# Patient Record
Sex: Female | Born: 1965 | Race: White | Hispanic: No | Marital: Single | Smoking: Former smoker
Health system: Southern US, Community
[De-identification: ages and names within clinical notes are randomized; demographics above are authoritative.]

## PROBLEM LIST (undated history)

## (undated) DIAGNOSIS — D171 Benign lipomatous neoplasm of skin and subcutaneous tissue of trunk: Secondary | ICD-10-CM

## (undated) DIAGNOSIS — R600 Localized edema: Secondary | ICD-10-CM

## (undated) DIAGNOSIS — M2550 Pain in unspecified joint: Secondary | ICD-10-CM

## (undated) DIAGNOSIS — R0602 Shortness of breath: Secondary | ICD-10-CM

## (undated) DIAGNOSIS — R7303 Prediabetes: Secondary | ICD-10-CM

## (undated) DIAGNOSIS — M549 Dorsalgia, unspecified: Secondary | ICD-10-CM

## (undated) DIAGNOSIS — E7800 Pure hypercholesterolemia, unspecified: Secondary | ICD-10-CM

## (undated) DIAGNOSIS — R1310 Dysphagia, unspecified: Secondary | ICD-10-CM

## (undated) DIAGNOSIS — I890 Lymphedema, not elsewhere classified: Secondary | ICD-10-CM

## (undated) DIAGNOSIS — K219 Gastro-esophageal reflux disease without esophagitis: Secondary | ICD-10-CM

## (undated) HISTORY — DX: Gastro-esophageal reflux disease without esophagitis: K21.9

## (undated) HISTORY — DX: Pain in unspecified joint: M25.50

## (undated) HISTORY — DX: Pure hypercholesterolemia, unspecified: E78.00

## (undated) HISTORY — PX: TUBAL LIGATION: SHX77

## (undated) HISTORY — DX: Localized edema: R60.0

## (undated) HISTORY — DX: Shortness of breath: R06.02

## (undated) HISTORY — DX: Prediabetes: R73.03

## (undated) HISTORY — DX: Benign lipomatous neoplasm of skin and subcutaneous tissue of trunk: D17.1

## (undated) HISTORY — DX: Dorsalgia, unspecified: M54.9

## (undated) HISTORY — DX: Lymphedema, not elsewhere classified: I89.0

## (undated) HISTORY — DX: Dysphagia, unspecified: R13.10

---

## 2008-01-01 ENCOUNTER — Emergency Department (HOSPITAL_BASED_OUTPATIENT_CLINIC_OR_DEPARTMENT_OTHER): Admission: EM | Admit: 2008-01-01 | Discharge: 2008-01-01 | Payer: Self-pay | Admitting: Emergency Medicine

## 2008-05-20 ENCOUNTER — Ambulatory Visit: Payer: Self-pay | Admitting: Diagnostic Radiology

## 2008-05-20 ENCOUNTER — Emergency Department (HOSPITAL_COMMUNITY): Admission: EM | Admit: 2008-05-20 | Discharge: 2008-05-21 | Payer: Self-pay | Admitting: Emergency Medicine

## 2008-05-20 ENCOUNTER — Emergency Department (HOSPITAL_BASED_OUTPATIENT_CLINIC_OR_DEPARTMENT_OTHER): Admission: EM | Admit: 2008-05-20 | Discharge: 2008-05-20 | Payer: Self-pay | Admitting: Emergency Medicine

## 2008-12-01 ENCOUNTER — Emergency Department (HOSPITAL_BASED_OUTPATIENT_CLINIC_OR_DEPARTMENT_OTHER): Admission: EM | Admit: 2008-12-01 | Discharge: 2008-12-02 | Payer: Self-pay | Admitting: Emergency Medicine

## 2008-12-02 ENCOUNTER — Ambulatory Visit: Payer: Self-pay | Admitting: Radiology

## 2009-01-13 ENCOUNTER — Emergency Department (HOSPITAL_BASED_OUTPATIENT_CLINIC_OR_DEPARTMENT_OTHER): Admission: EM | Admit: 2009-01-13 | Discharge: 2009-01-14 | Payer: Self-pay | Admitting: Emergency Medicine

## 2010-04-07 LAB — URINALYSIS, ROUTINE W REFLEX MICROSCOPIC
Bilirubin Urine: NEGATIVE
Hgb urine dipstick: NEGATIVE
Ketones, ur: NEGATIVE mg/dL
Specific Gravity, Urine: 1.033 — ABNORMAL HIGH (ref 1.005–1.030)
pH: 5 (ref 5.0–8.0)

## 2010-04-07 LAB — COMPREHENSIVE METABOLIC PANEL
ALT: 16 U/L (ref 0–35)
AST: 22 U/L (ref 0–37)
Alkaline Phosphatase: 73 U/L (ref 39–117)
CO2: 27 mEq/L (ref 19–32)
Calcium: 9.4 mg/dL (ref 8.4–10.5)
GFR calc Af Amer: >60 mL/min (ref >60)
GFR calc non Af Amer: >60 mL/min (ref >60)
Glucose, Bld: 93 mg/dL (ref 70–99)
Potassium: 4.4 mEq/L (ref 3.5–5.1)
Sodium: 141 mEq/L (ref 135–145)
Total Protein: 7.9 g/dL (ref 6.0–8.3)

## 2010-04-07 LAB — DIFFERENTIAL
Basophils Relative: 2 % — ABNORMAL HIGH (ref 0–1)
Eosinophils Absolute: 0.1 10*3/uL (ref 0.0–0.7)
Eosinophils Relative: 1 % (ref 0–5)
Lymphs Abs: 3.5 10*3/uL (ref 0.7–4.0)
Monocytes Relative: 6 % (ref 3–12)
Neutrophils Relative %: 66 % (ref 43–77)

## 2010-04-07 LAB — CBC
Hemoglobin: 14 g/dL (ref 12.0–15.0)
MCHC: 34.7 g/dL (ref 30.0–36.0)
RBC: 4.27 MIL/uL (ref 3.87–5.11)

## 2010-04-07 LAB — URINE MICROSCOPIC-ADD ON

## 2010-04-13 LAB — URINE CULTURE: Colony Count: NO GROWTH

## 2010-04-13 LAB — URINALYSIS, ROUTINE W REFLEX MICROSCOPIC
Glucose, UA: NEGATIVE mg/dL
Hgb urine dipstick: NEGATIVE
Ketones, ur: 15 mg/dL — AB
pH: 6 (ref 5.0–8.0)

## 2012-06-08 DIAGNOSIS — Z801 Family history of malignant neoplasm of trachea, bronchus and lung: Secondary | ICD-10-CM | POA: Insufficient documentation

## 2012-06-08 DIAGNOSIS — J449 Chronic obstructive pulmonary disease, unspecified: Secondary | ICD-10-CM | POA: Insufficient documentation

## 2012-06-08 DIAGNOSIS — Z87891 Personal history of nicotine dependence: Secondary | ICD-10-CM | POA: Insufficient documentation

## 2012-06-14 DIAGNOSIS — E785 Hyperlipidemia, unspecified: Secondary | ICD-10-CM | POA: Insufficient documentation

## 2013-01-23 DIAGNOSIS — G4730 Sleep apnea, unspecified: Secondary | ICD-10-CM | POA: Insufficient documentation

## 2013-02-22 DIAGNOSIS — I872 Venous insufficiency (chronic) (peripheral): Secondary | ICD-10-CM | POA: Insufficient documentation

## 2015-03-23 ENCOUNTER — Emergency Department (HOSPITAL_BASED_OUTPATIENT_CLINIC_OR_DEPARTMENT_OTHER): Payer: 59

## 2015-03-23 ENCOUNTER — Encounter (HOSPITAL_BASED_OUTPATIENT_CLINIC_OR_DEPARTMENT_OTHER): Payer: Self-pay | Admitting: Emergency Medicine

## 2015-03-23 ENCOUNTER — Emergency Department (HOSPITAL_BASED_OUTPATIENT_CLINIC_OR_DEPARTMENT_OTHER)
Admission: EM | Admit: 2015-03-23 | Discharge: 2015-03-24 | Disposition: A | Discharge status: Home / Self Care | Payer: 59 | Attending: Emergency Medicine | Admitting: Emergency Medicine

## 2015-03-23 DIAGNOSIS — F17200 Nicotine dependence, unspecified, uncomplicated: Secondary | ICD-10-CM | POA: Diagnosis not present

## 2015-03-23 DIAGNOSIS — R61 Generalized hyperhidrosis: Secondary | ICD-10-CM | POA: Diagnosis not present

## 2015-03-23 DIAGNOSIS — R0789 Other chest pain: Secondary | ICD-10-CM | POA: Insufficient documentation

## 2015-03-23 DIAGNOSIS — J209 Acute bronchitis, unspecified: Secondary | ICD-10-CM | POA: Insufficient documentation

## 2015-03-23 DIAGNOSIS — R079 Chest pain, unspecified: Secondary | ICD-10-CM | POA: Diagnosis present

## 2015-03-23 LAB — CBC WITH DIFFERENTIAL/PLATELET
BASOS PCT: 0 %
Basophils Absolute: 0 10*3/uL (ref 0.0–0.1)
EOS ABS: 0.2 10*3/uL (ref 0.0–0.7)
EOS PCT: 1 %
HCT: 40.5 % (ref 36.0–46.0)
Hemoglobin: 13.4 g/dL (ref 12.0–15.0)
LYMPHS ABS: 4 10*3/uL (ref 0.7–4.0)
Lymphocytes Relative: 37 %
MCH: 32.1 pg (ref 26.0–34.0)
MCHC: 33.1 g/dL (ref 30.0–36.0)
MCV: 96.9 fL (ref 78.0–100.0)
Monocytes Absolute: 0.7 10*3/uL (ref 0.1–1.0)
Monocytes Relative: 6 %
NEUTROS PCT: 56 %
Neutro Abs: 6 10*3/uL (ref 1.7–7.7)
PLATELETS: 324 10*3/uL (ref 150–400)
RBC: 4.18 MIL/uL (ref 3.87–5.11)
RDW: 13.4 % (ref 11.5–15.5)
WBC: 10.8 10*3/uL — AB (ref 4.0–10.5)

## 2015-03-23 LAB — BASIC METABOLIC PANEL
Anion gap: 7 (ref 5–15)
BUN: 23 mg/dL — AB (ref 6–20)
CO2: 27 mmol/L (ref 22–32)
CREATININE: 0.53 mg/dL (ref 0.44–1.00)
Calcium: 8.8 mg/dL — ABNORMAL LOW (ref 8.9–10.3)
Chloride: 102 mmol/L (ref 101–111)
Glucose, Bld: 96 mg/dL (ref 65–99)
POTASSIUM: 4.1 mmol/L (ref 3.5–5.1)
SODIUM: 136 mmol/L (ref 135–145)

## 2015-03-23 LAB — TROPONIN I

## 2015-03-23 MED ORDER — IPRATROPIUM-ALBUTEROL 0.5-2.5 (3) MG/3ML IN SOLN
3.0000 mL | RESPIRATORY_TRACT | Status: DC
  Administered 2015-03-23: 3 mL via RESPIRATORY_TRACT
  Filled 2015-03-23: qty 3

## 2015-03-23 NOTE — ED Notes (Signed)
Patient states that she was moving a cart at work and started to have pain to her left chest. The patient reports that is is worse with movement and  Taking a deep breath

## 2015-03-23 NOTE — ED Notes (Addendum)
Pt states she was passing meds at work and developed a shooting pain in the middle of her chest. Denies other s/s. BBS diminished. Edema noted to lower ext. Pt states this occurs when she works a lot.

## 2015-03-23 NOTE — ED Provider Notes (Addendum)
CSN: [id number]     Arrival date & time 03/23/15  2010 History  By signing my name below, I, Helane Gunther, attest that this documentation has been prepared under the direction and in the presence of Shanon Rosser, MD. Electronically Signed: Helane Gunther, ED Scribe. 03/23/2015. 11:07 PM.      Chief Complaint  Patient presents with  . Chest Pain   The history is provided by the patient. No language interpreter was used.   HPI Comments: Michele Ayers is a 50 y.o. female who presents to the Emergency Department complaining of sharp, shooting, well localized, left-sided chest pain onset between 3 and 4 hours ago. Pt states she was at work when the pain began, lasting for about 15 minutes before resolving, then resuming once more and growing constant. She currently rates her pain as a 5/10. She reports associated diaphoresis with the first episode only, as well as continued SOB. She notes exacerbation of the pain with deep breathing and movement, and alleviation with sitting slightly propped up at rest. She notes her legs are typically swollen when she works a lot of hours and is on her feet for long periods of time. Pt denies n/v/d.  History reviewed. No pertinent past medical history. Past Surgical History  Procedure Laterality Date  . Tubal ligation     History reviewed. No pertinent family history. Social History  Substance Use Topics  . Smoking status: Current Every Day Smoker  . Smokeless tobacco: None  . Alcohol Use: No   OB History    No data available     Review of Systems  All other systems reviewed and are negative.   Allergies  Review of patient's allergies indicates no known allergies.  Home Medications   Prior to Admission medications   Not on File   BP 104/70 mmHg  Pulse 78  Temp(Src) 98.7 F (37.1 C) (Oral)  Resp 20  Ht 5\' 4"  (1.626 m)  Wt 270 lb (122.471 kg)  BMI 46.32 kg/m2  SpO2 96%  LMP 03/09/2015 Physical Exam General: Well-developed,  well-nourished female in no acute distress; appearance consistent with age of record HENT: normocephalic; atraumatic Eyes: pupils equal, round and reactive to light; extraocular muscles intact Neck: supple Heart: regular rate and rhythm; no murmurs, rubs or gallops Lungs: decreased breath sounds bilaterally Chest: Left upper chest wall tenderness that is not exactly the same pain is that of the chief complaint Abdomen: soft; nondistended; nontender; no masses or hepatosplenomegaly; bowel sounds present Extremities: No deformity; full range of motion; pulses normal; 2+ pitting edema of the lower legs with chronic-appearing stasis changes Neurologic: Awake, alert and oriented; motor function intact in all extremities and symmetric; no facial droop Skin: Warm and dry Psychiatric: Normal mood and affect  ED Course  Procedures   MDM   Nursing notes and vitals signs, including pulse oximetry, reviewed.  Summary of this visit's results, reviewed by myself:   EKG Interpretation  Date/Time:  Monday March 23 2015 20:21:09 EDT Ventricular Rate:  81 PR Interval:  162 QRS Duration: 86 QT Interval:  364 QTC Calculation: 422 R Axis:   72 Text Interpretation:  Normal sinus rhythm Normal ECG No previous ECGs available Confirmed by Kamrin Spath  MD, Jenny Reichmann ([id number]) on 03/23/2015 10:51:04 PM       Labs:  Results for orders placed or performed during the hospital encounter of 03/23/15 (from the past 24 hour(s))  CBC with Differential/Platelet     Status: Abnormal   Collection Time: 03/23/15 11:20  PM  Result Value Ref Range   WBC 10.8 (H) 4.0 - 10.5 K/uL   RBC 4.18 3.87 - 5.11 MIL/uL   Hemoglobin 13.4 12.0 - 15.0 g/dL   HCT 40.5 36.0 - 46.0 %   MCV 96.9 78.0 - 100.0 fL   MCH 32.1 26.0 - 34.0 pg   MCHC 33.1 30.0 - 36.0 g/dL   RDW 13.4 11.5 - 15.5 %   Platelets 324 150 - 400 K/uL   Neutrophils Relative % 56 %   Neutro Abs 6.0 1.7 - 7.7 K/uL   Lymphocytes Relative 37 %   Lymphs Abs 4.0 0.7 - 4.0  K/uL   Monocytes Relative 6 %   Monocytes Absolute 0.7 0.1 - 1.0 K/uL   Eosinophils Relative 1 %   Eosinophils Absolute 0.2 0.0 - 0.7 K/uL   Basophils Relative 0 %   Basophils Absolute 0.0 0.0 - 0.1 K/uL  Basic metabolic panel     Status: Abnormal   Collection Time: 03/23/15 11:20 PM  Result Value Ref Range   Sodium 136 135 - 145 mmol/L   Potassium 4.1 3.5 - 5.1 mmol/L   Chloride 102 101 - 111 mmol/L   CO2 27 22 - 32 mmol/L   Glucose, Bld 96 65 - 99 mg/dL   BUN 23 (H) 6 - 20 mg/dL   Creatinine, Ser 0.53 0.44 - 1.00 mg/dL   Calcium 8.8 (L) 8.9 - 10.3 mg/dL   GFR calc non Af Amer >60 >60 mL/min   GFR calc Af Amer >60 >60 mL/min   Anion gap 7 5 - 15  Troponin I     Status: None   Collection Time: 03/23/15 11:20 PM  Result Value Ref Range   Troponin I <0.03 <0.031 ng/mL    Imaging Studies: Dg Chest 2 View  03/23/2015  CLINICAL DATA:  Left-sided chest pain beginning tonight. Current smoker. EXAM: CHEST  2 VIEW COMPARISON:  None. FINDINGS: The cardiomediastinal silhouette is within normal limits. The lungs are well inflated with mild central airway thickening and mild basilar predominant interstitial prominence. No confluent airspace opacity, edema, pleural effusion, or pneumothorax is identified. Thoracic spondylosis is noted. IMPRESSION: Mild bronchitic changes. Electronically Signed   By: Logan Bores M.D.   On: 03/23/2015 21:24   12:05 AM Patient's pain is atypical for cardiac etiology. It is reproduced with movement and deep breathing. Her breathing is improved after a DuoNeb treatment and we will treat her with an inhaler.   I personally performed the services described in this documentation, which was scribed in my presence. The recorded information has been reviewed and is accurate.   Shanon Rosser, MD 03/24/15 [id number]  Shanon Rosser, MD 03/24/15 [id number]

## 2015-03-24 MED ORDER — ALBUTEROL SULFATE HFA 108 (90 BASE) MCG/ACT IN AERS
2.0000 | INHALATION_SPRAY | RESPIRATORY_TRACT | Status: DC | PRN
  Administered 2015-03-24: 2 via RESPIRATORY_TRACT
  Filled 2015-03-24: qty 6.7

## 2015-03-24 NOTE — ED Notes (Signed)
Pt given d/c instructions as per chart. Verbalizes understanding. No questions. 

## 2015-06-11 ENCOUNTER — Emergency Department (HOSPITAL_BASED_OUTPATIENT_CLINIC_OR_DEPARTMENT_OTHER): Payer: 59

## 2015-06-11 ENCOUNTER — Emergency Department (HOSPITAL_BASED_OUTPATIENT_CLINIC_OR_DEPARTMENT_OTHER)
Admission: EM | Admit: 2015-06-11 | Discharge: 2015-06-11 | Disposition: A | Discharge status: Home / Self Care | Payer: 59 | Attending: Emergency Medicine | Admitting: Emergency Medicine

## 2015-06-11 ENCOUNTER — Encounter (HOSPITAL_BASED_OUTPATIENT_CLINIC_OR_DEPARTMENT_OTHER): Payer: Self-pay

## 2015-06-11 DIAGNOSIS — F17200 Nicotine dependence, unspecified, uncomplicated: Secondary | ICD-10-CM | POA: Insufficient documentation

## 2015-06-11 DIAGNOSIS — L03119 Cellulitis of unspecified part of limb: Secondary | ICD-10-CM | POA: Diagnosis not present

## 2015-06-11 DIAGNOSIS — M7989 Other specified soft tissue disorders: Secondary | ICD-10-CM | POA: Diagnosis present

## 2015-06-11 DIAGNOSIS — R609 Edema, unspecified: Secondary | ICD-10-CM

## 2015-06-11 LAB — COMPREHENSIVE METABOLIC PANEL
ALBUMIN: 3.9 g/dL (ref 3.5–5.0)
ALK PHOS: 66 U/L (ref 38–126)
ALT: 16 U/L (ref 14–54)
AST: 17 U/L (ref 15–41)
Anion gap: 8 (ref 5–15)
BUN: 15 mg/dL (ref 6–20)
CHLORIDE: 98 mmol/L — AB (ref 101–111)
CO2: 27 mmol/L (ref 22–32)
CREATININE: 0.7 mg/dL (ref 0.44–1.00)
Calcium: 9.3 mg/dL (ref 8.9–10.3)
Glucose, Bld: 90 mg/dL (ref 65–99)
POTASSIUM: 3.9 mmol/L (ref 3.5–5.1)
SODIUM: 133 mmol/L — AB (ref 135–145)
TOTAL PROTEIN: 7.4 g/dL (ref 6.5–8.1)
Total Bilirubin: 0.7 mg/dL (ref 0.3–1.2)

## 2015-06-11 LAB — CBC WITH DIFFERENTIAL/PLATELET
BASOS ABS: 0 10*3/uL (ref 0.0–0.1)
BASOS PCT: 0 %
EOS ABS: 0.1 10*3/uL (ref 0.0–0.7)
Eosinophils Relative: 1 %
HCT: 39.7 % (ref 36.0–46.0)
HEMOGLOBIN: 13.2 g/dL (ref 12.0–15.0)
Lymphocytes Relative: 31 %
Lymphs Abs: 3.4 10*3/uL (ref 0.7–4.0)
MCH: 32.4 pg (ref 26.0–34.0)
MCHC: 33.2 g/dL (ref 30.0–36.0)
MCV: 97.5 fL (ref 78.0–100.0)
Monocytes Absolute: 0.9 10*3/uL (ref 0.1–1.0)
Monocytes Relative: 8 %
NEUTROS PCT: 60 %
Neutro Abs: 6.4 10*3/uL (ref 1.7–7.7)
Platelets: 337 10*3/uL (ref 150–400)
RBC: 4.07 MIL/uL (ref 3.87–5.11)
RDW: 13.4 % (ref 11.5–15.5)
WBC: 10.7 10*3/uL — AB (ref 4.0–10.5)

## 2015-06-11 MED ORDER — CEPHALEXIN 250 MG PO CAPS
500.0000 mg | ORAL_CAPSULE | Freq: Once | ORAL | Status: AC
  Administered 2015-06-11: 500 mg via ORAL
  Filled 2015-06-11: qty 2

## 2015-06-11 MED ORDER — FUROSEMIDE 20 MG PO TABS: 20.0000 mg | ORAL_TABLET | Freq: Every day | ORAL | Status: DC | PRN

## 2015-06-11 MED ORDER — CEPHALEXIN 500 MG PO CAPS: 500.0000 mg | ORAL_CAPSULE | Freq: Three times a day (TID) | ORAL | Status: DC

## 2015-06-11 NOTE — ED Notes (Signed)
C/o bilat LE swelling x "couple weeks"-NAD-steady gait

## 2015-06-11 NOTE — ED Provider Notes (Signed)
CSN: [id number]     Arrival date & time 06/11/15  1556 History   First MD Initiated Contact with Patient 06/11/15 1622     Chief Complaint  Patient presents with  . Leg Swelling      HPI  Patient presents for evaluation of leg swelling. She reports progressive leg swelling over the last few weeks. She works on her feet most of the day. Doesn't do a lot of sitting. Does smoke. No estrogen use. No history of malignancy. No prolonged immobilization cast once fractures surgeries malignancies or DVT or PE risk. No history DVT or PE. She noticed that her left leg became red on the anterior shin of the right foot became red as well and she became concerned about infection or blood clot and presents here.  History reviewed. No pertinent past medical history. Past Surgical History  Procedure Laterality Date  . Tubal ligation     No family history on file. Social History  Substance Use Topics  . Smoking status: Current Every Day Smoker  . Smokeless tobacco: None  . Alcohol Use: No   OB History    No data available     Review of Systems  Constitutional: Negative for fever, chills, diaphoresis, appetite change and fatigue.  HENT: Negative for mouth sores, sore throat and trouble swallowing.   Eyes: Negative for visual disturbance.  Respiratory: Negative for cough, chest tightness, shortness of breath and wheezing.   Cardiovascular: Positive for leg swelling. Negative for chest pain.  Gastrointestinal: Negative for nausea, vomiting, abdominal pain, diarrhea and abdominal distention.  Endocrine: Negative for polydipsia, polyphagia and polyuria.  Genitourinary: Negative for dysuria, frequency and hematuria.  Musculoskeletal: Negative for gait problem.  Skin: Positive for color change. Negative for pallor and rash.  Neurological: Negative for dizziness, syncope, light-headedness and headaches.  Hematological: Does not bruise/bleed easily.  Psychiatric/Behavioral: Negative for behavioral  problems and confusion.      Allergies  Review of patient's allergies indicates no known allergies.  Home Medications   Prior to Admission medications   Medication Sig Start Date End Date Taking? Authorizing Provider  cephALEXin (KEFLEX) 500 MG capsule Take 1 capsule (500 mg total) by mouth 3 (three) times daily. 06/11/15   Tanna Furry, MD  furosemide (LASIX) 20 MG tablet Take 1 tablet (20 mg total) by mouth daily as needed (leg swelling). 06/11/15   Tanna Furry, MD   BP 122/58 mmHg  Pulse 66  Temp(Src) 98.9 F (37.2 C) (Oral)  Resp 20  Ht 5\' 3"  (1.6 m)  Wt 285 lb (129.275 kg)  BMI 50.50 kg/m2  SpO2 96%  LMP 05/28/2015 Physical Exam  Constitutional: She is oriented to person, place, and time. She appears well-developed and well-nourished. No distress.  HENT:  Head: Normocephalic.  Eyes: Conjunctivae are normal. Pupils are equal, round, and reactive to light. No scleral icterus.  Neck: Normal range of motion. Neck supple. No thyromegaly present.  Cardiovascular: Normal rate and regular rhythm.  Exam reveals no gallop and no friction rub.   No murmur heard. Pulmonary/Chest: Effort normal and breath sounds normal. No respiratory distress. She has no wheezes. She has no rales.  Abdominal: Soft. Bowel sounds are normal. She exhibits no distension. There is no tenderness. There is no rebound.  Musculoskeletal: Normal range of motion.  Neurological: She is alert and oriented to person, place, and time.  Skin: Skin is warm and dry. No rash noted.  2+ symmetric bilateral extremity edema. Some erythema of the dorsum of the  right foot. Some erythema and anterior aspect the left lower leg. No palpable cord. He has an area of skin just under the webspace of her first and second toe of the right foot that may be an area than secondarily infected tinea  Psychiatric: She has a normal mood and affect. Her behavior is normal.    ED Course  Procedures (including critical care time) Labs  Review Labs Reviewed  CBC WITH DIFFERENTIAL/PLATELET - Abnormal; Notable for the following:    WBC 10.7 (*)    All other components within normal limits  COMPREHENSIVE METABOLIC PANEL - Abnormal; Notable for the following:    Sodium 133 (*)    Chloride 98 (*)    All other components within normal limits    Imaging Review US Venous Img Lower Bilateral  06/11/2015  CLINICAL DATA:  50 year old female with bilateral lower extremity swelling x2 weeks. EXAM: BILATERAL LOWER EXTREMITY VENOUS DOPPLER ULTRASOUND TECHNIQUE: Gray-scale sonography with graded compression, as well as color Doppler and duplex ultrasound were performed to evaluate the lower extremity deep venous systems from the level of the common femoral vein and including the common femoral, femoral, profunda femoral, popliteal and calf veins including the posterior tibial, peroneal and gastrocnemius veins when visible. The superficial great saphenous vein was also interrogated. Spectral Doppler was utilized to evaluate flow at rest and with distal augmentation maneuvers in the common femoral, femoral and popliteal veins. COMPARISON:  None. FINDINGS: RIGHT LOWER EXTREMITY Common Femoral Vein: No evidence of thrombus. Normal compressibility, respiratory phasicity and response to augmentation. Saphenofemoral Junction: No evidence of thrombus. Normal compressibility and flow on color Doppler imaging. Profunda Femoral Vein: No evidence of thrombus. Normal compressibility and flow on color Doppler imaging. Femoral Vein: No evidence of thrombus. Normal compressibility, respiratory phasicity and response to augmentation. Popliteal Vein: No evidence of thrombus. Normal compressibility, respiratory phasicity and response to augmentation. Calf Veins: The peroneal vein is not well visualized. The visualized portion of the posterior tibial vein appears patent. Superficial Great Saphenous Vein: No evidence of thrombus. Normal compressibility and flow on color  Doppler imaging. Venous Reflux:  None. Other Findings:  Subcutaneous edema noted in the calf. LEFT LOWER EXTREMITY Common Femoral Vein: No evidence of thrombus. Normal compressibility, respiratory phasicity and response to augmentation. Saphenofemoral Junction: No evidence of thrombus. Normal compressibility and flow on color Doppler imaging. Profunda Femoral Vein: No evidence of thrombus. Normal compressibility and flow on color Doppler imaging. Femoral Vein: No evidence of thrombus. Normal compressibility, respiratory phasicity and response to augmentation. Popliteal Vein: No evidence of thrombus. Normal compressibility, respiratory phasicity and response to augmentation. Calf Veins: The the peroneal vein not visualized. The visualized Posterior tibial vein appears patent. Superficial Great Saphenous Vein: No evidence of thrombus. Normal compressibility and flow on color Doppler imaging. Venous Reflux:  None. Other Findings:  Subcutaneous edema noted in the calf. IMPRESSION: No evidence of deep venous thrombosis in the bilateral lower extremities. Bilateral calf subcutaneous edema. Electronically Signed   By: Anner Crete M.D.   On: 06/11/2015 19:13   I have personally reviewed and evaluated these images and lab results as part of my medical decision-making.   EKG Interpretation None      MDM   Final diagnoses:  Dependent edema  Cellulitis of lower extremity, unspecified laterality    Dopplers without DVT. Normal protein, albumin, and renal function. Minimal leukocytosis. Think his symptoms are most consistent with dependent edema. She is likely got some secondary cellulitis on the left shin, as  well as the right foot. Has a area that looks as though it started as tinea under her right great toe and now has a secondary lymphangitis that is subtle. Plan is daily/when necessary Lasix. 7 day course. Primary care for not improving. Support stockings. Elevate legs when possible.    Tanna Furry,  MD 06/11/15 [phone]

## 2015-06-11 NOTE — Discharge Instructions (Signed)
Cellulitis Cellulitis is an infection of the skin and the tissue beneath it. The infected area is usually red and tender. Cellulitis occurs most often in the arms and lower legs.  CAUSES  Cellulitis is caused by bacteria that enter the skin through cracks or cuts in the skin. The most common types of bacteria that cause cellulitis are staphylococci and streptococci. SIGNS AND SYMPTOMS   Redness and warmth.  Swelling.  Tenderness or pain.  Fever. DIAGNOSIS  Your health care provider can usually determine what is wrong based on a physical exam. Blood tests may also be done. TREATMENT  Treatment usually involves taking an antibiotic medicine. HOME CARE INSTRUCTIONS   Take your antibiotic medicine as directed by your health care provider. Finish the antibiotic even if you start to feel better.  Keep the infected arm or leg elevated to reduce swelling.  Apply a warm cloth to the affected area up to 4 times per day to relieve pain.  Take medicines only as directed by your health care provider.  Keep all follow-up visits as directed by your health care provider. SEEK MEDICAL CARE IF:   You notice red streaks coming from the infected area.  Your red area gets larger or turns dark in color.  Your bone or joint underneath the infected area becomes painful after the skin has healed.  Your infection returns in the same area or another area.  You notice a swollen bump in the infected area.  You develop new symptoms.  You have a fever. SEEK IMMEDIATE MEDICAL CARE IF:   You feel very sleepy.  You develop vomiting or diarrhea.  You have a general ill feeling (malaise) with muscle aches and pains.   This information is not intended to replace advice given to you by your health care provider. Make sure you discuss any questions you have with your health care provider.   Document Released: 09/29/2004 Document Revised: 09/10/2014 Document Reviewed: 03/07/2011 Elsevier Interactive  Patient Education 2016 [employer]  Edema Edema is an abnormal buildup of fluids in your bodytissues. Edema is somewhatdependent on gravity to pull the fluid to the lowest place in your body. That makes the condition more common in the legs and thighs (lower extremities). Painless swelling of the feet and ankles is common and becomes more likely as you get older. It is also common in looser tissues, like around your eyes.  When the affected area is squeezed, the fluid may move out of that spot and leave a dent for a few moments. This dent is called pitting.  CAUSES  There are many possible causes of edema. Eating too much salt and being on your feet or sitting for a long time can cause edema in your legs and ankles. Hot weather may make edema worse. Common medical causes of edema include:  Heart failure.  Liver disease.  Kidney disease.  Weak blood vessels in your legs.  Cancer.  An injury.  Pregnancy.  Some medications.  Obesity. SYMPTOMS  Edema is usually painless.Your skin may look swollen or shiny.  DIAGNOSIS  Your health care provider may be able to diagnose edema by asking about your medical history and doing a physical exam. You may need to have tests such as X-rays, an electrocardiogram, or blood tests to check for medical conditions that may cause edema.  TREATMENT  Edema treatment depends on the cause. If you have heart, liver, or kidney disease, you need the treatment appropriate for these conditions. General treatment may include:  Elevation of the affected body part above the level of your heart.  Compression of the affected body part. Pressure from elastic bandages or support stockings squeezes the tissues and forces fluid back into the blood vessels. This keeps fluid from entering the tissues.  Restriction of fluid and salt intake.  Use of a water pill (diuretic). These medications are appropriate only for some types of edema. They pull fluid out of your  body and make you urinate more often. This gets rid of fluid and reduces swelling, but diuretics can have side effects. Only use diuretics as directed by your health care provider. HOME CARE INSTRUCTIONS   Keep the affected body part above the level of your heart when you are lying down.   Do not sit still or stand for prolonged periods.   Do not put anything directly under your knees when lying down.  Do not wear constricting clothing or garters on your upper legs.   Exercise your legs to work the fluid back into your blood vessels. This may help the swelling go down.   Wear elastic bandages or support stockings to reduce ankle swelling as directed by your health care provider.   Eat a low-salt diet to reduce fluid if your health care provider recommends it.   Only take medicines as directed by your health care provider. SEEK MEDICAL CARE IF:   Your edema is not responding to treatment.  You have heart, liver, or kidney disease and notice symptoms of edema.  You have edema in your legs that does not improve after elevating them.   You have sudden and unexplained weight gain. SEEK IMMEDIATE MEDICAL CARE IF:   You develop shortness of breath or chest pain.   You cannot breathe when you lie down.  You develop pain, redness, or warmth in the swollen areas.   You have heart, liver, or kidney disease and suddenly get edema.  You have a fever and your symptoms suddenly get worse. MAKE SURE YOU:   Understand these instructions.  Will watch your condition.  Will get help right away if you are not doing well or get worse.   This information is not intended to replace advice given to you by your health care provider. Make sure you discuss any questions you have with your health care provider.   Document Released: 12/20/2004 Document Revised: 01/10/2014 Document Reviewed: 10/12/2012 Elsevier Interactive Patient Education [employer].

## 2019-01-03 ENCOUNTER — Encounter: Payer: Self-pay | Admitting: Emergency Medicine

## 2019-01-03 ENCOUNTER — Telehealth: Payer: Self-pay | Admitting: *Deleted

## 2019-01-03 ENCOUNTER — Other Ambulatory Visit: Payer: Self-pay

## 2019-01-03 ENCOUNTER — Ambulatory Visit (INDEPENDENT_AMBULATORY_CARE_PROVIDER_SITE_OTHER): Payer: No Typology Code available for payment source | Admitting: Emergency Medicine

## 2019-01-03 VITALS — BP 134/75 | HR 82 | Temp 98.2°F | Resp 16 | Ht 64.0 in | Wt 340.0 lb

## 2019-01-03 DIAGNOSIS — N390 Urinary tract infection, site not specified: Secondary | ICD-10-CM | POA: Diagnosis not present

## 2019-01-03 DIAGNOSIS — L03116 Cellulitis of left lower limb: Secondary | ICD-10-CM

## 2019-01-03 DIAGNOSIS — R350 Frequency of micturition: Secondary | ICD-10-CM | POA: Diagnosis not present

## 2019-01-03 DIAGNOSIS — L03115 Cellulitis of right lower limb: Secondary | ICD-10-CM

## 2019-01-03 DIAGNOSIS — Z7689 Persons encountering health services in other specified circumstances: Secondary | ICD-10-CM

## 2019-01-03 DIAGNOSIS — I890 Lymphedema, not elsewhere classified: Secondary | ICD-10-CM | POA: Diagnosis not present

## 2019-01-03 DIAGNOSIS — Z6841 Body Mass Index (BMI) 40.0 and over, adult: Secondary | ICD-10-CM

## 2019-01-03 LAB — POCT URINALYSIS DIP (MANUAL ENTRY)
Bilirubin, UA: NEGATIVE
Blood, UA: NEGATIVE
Glucose, UA: NEGATIVE mg/dL
Ketones, POC UA: NEGATIVE mg/dL
Leukocytes, UA: NEGATIVE
Nitrite, UA: NEGATIVE
Protein Ur, POC: NEGATIVE mg/dL
Spec Grav, UA: 1.025 (ref 1.010–1.025)
Urobilinogen, UA: 1 E.U./dL
pH, UA: 7 (ref 5.0–8.0)

## 2019-01-03 MED ORDER — CEPHALEXIN 500 MG PO CAPS: 500.0000 mg | ORAL_CAPSULE | Freq: Three times a day (TID) | ORAL | 0 refills | Status: AC

## 2019-01-03 NOTE — Progress Notes (Signed)
Michele Ayers 53 y.o.   Chief Complaint  Patient presents with  . Leg Pain    x 3 weeks ago,both and RIGHT leg weeping fluid with swelling-yellow  . Establish Care    urinary freq with odor x 2 days    HISTORY OF PRESENT ILLNESS: [name] first visit to this office here to establish care. Patient has a history of chronic leg edema and has tried multiple treatments in the past with little success. Recent ER visit as follows: Instructions Sherre Poot, MD - 11/24/2018  You were seen in the emergency room because of your leg swelling. You had reassuring blood work, urinary studies and ultrasound. As we discussed aspirin recommend you use Ace wraps or compression stockings. Please keep your legs elevated when you are not walking, put pillows underneath your legs to help with the swelling. Please restart your water pill by talking with your primary care doctor. If you have any worsening swelling, begin to spike a fever or notice redness tracking up the leg this could be a sign of infection and you need to return to the emergency room.  Today also complaining of urinary frequency and burning with bad smell for couple days. No other complaints or associated symptoms.  HPI   Prior to Admission medications   Medication Sig Start Date End Date Taking? Authorizing Provider  cephALEXin (KEFLEX) 500 MG capsule Take 1 capsule (500 mg total) by mouth 3 (three) times daily. Patient not taking: Reported on 01/03/2019 06/11/15   Tanna Furry, MD  furosemide (LASIX) 20 MG tablet Take 1 tablet (20 mg total) by mouth daily as needed (leg swelling). Patient not taking: Reported on 01/03/2019 06/11/15   Tanna Furry, MD    No Known Allergies  There are no problems to display for this patient.   History reviewed. No pertinent past medical history.  Past Surgical History:  Procedure Laterality Date  . TUBAL LIGATION      Social History   Socioeconomic History  . Marital  status: Single    Spouse name: Not on file  . Number of children: Not on file  . Years of education: Not on file  . Highest education level: Not on file  Occupational History  . Not on file  Tobacco Use  . Smoking status: Current Every Day Smoker  Substance and Sexual Activity  . Alcohol use: No  . Drug use: No  . Sexual activity: Not on file  Other Topics Concern  . Not on file  Social History Narrative  . Not on file   Social Determinants of Health   Financial Resource Strain:   . Difficulty of Paying Living Expenses: Not on file  Food Insecurity:   . Worried About Charity fundraiser in the Last Year: Not on file  . Ran Out of Food in the Last Year: Not on file  Transportation Needs:   . Lack of Transportation (Medical): Not on file  . Lack of Transportation (Non-Medical): Not on file  Physical Activity:   . Days of Exercise per Week: Not on file  . Minutes of Exercise per Session: Not on file  Stress:   . Feeling of Stress : Not on file  Social Connections:   . Frequency of Communication with Friends and Family: Not on file  . Frequency of Social Gatherings with Friends and Family: Not on file  . Attends Religious Services: Not on file  . Active Member of Clubs or Organizations:  Not on file  . Attends Archivist Meetings: Not on file  . Marital Status: Not on file  Intimate Partner Violence:   . Fear of Current or Ex-Partner: Not on file  . Emotionally Abused: Not on file  . Physically Abused: Not on file  . Sexually Abused: Not on file    History reviewed. No pertinent family history.   Review of Systems  Constitutional: Negative.  Negative for chills and fever.  HENT: Negative.   Respiratory: Negative.  Negative for cough and shortness of breath.   Cardiovascular: Negative.  Negative for chest pain and palpitations.  Gastrointestinal: Negative for abdominal pain, diarrhea, nausea and vomiting.  Genitourinary: Positive for dysuria, frequency and  urgency.  Musculoskeletal: Negative.  Negative for myalgias.  Skin: Negative.  Negative for rash.  Neurological: Negative.   Endo/Heme/Allergies: Negative.   All other systems reviewed and are negative.  Today's Vitals   01/03/19 1010  BP: 134/75  Pulse: 82  Resp: 16  Temp: 98.2 F (36.8 C)  TempSrc: Temporal  SpO2: 95%  Weight: (!) 340 lb (154.2 kg)  Height: 5\' 4"  (1.626 m)   Body mass index is 58.36 kg/m.   Physical Exam Vitals reviewed.  Constitutional:      Appearance: Normal appearance. She is obese.  HENT:     Head: Normocephalic.  Eyes:     Extraocular Movements: Extraocular movements intact.     Pupils: Pupils are equal, round, and reactive to light.  Cardiovascular:     Rate and Rhythm: Normal rate and regular rhythm.     Heart sounds: Normal heart sounds.  Pulmonary:     Effort: Pulmonary effort is normal.     Breath sounds: Normal breath sounds.  Abdominal:     Palpations: Abdomen is soft.     Tenderness: There is no abdominal tenderness.  Musculoskeletal:     Cervical back: Normal range of motion and neck supple.     Right lower leg: Edema present.     Left lower leg: Edema present.  Skin:    General: Skin is warm and dry.     Capillary Refill: Capillary refill takes less than 2 seconds.     Comments: Positive bilateral lymphedema of lower extremities with erythema and areas of cellulitis  Neurological:     General: No focal deficit present.     Mental Status: She is alert and oriented to person, place, and time.  Psychiatric:        Mood and Affect: Mood normal.        Behavior: Behavior normal.      ASSESSMENT & PLAN: Michele Ayers was seen today for leg pain and establish care.  Diagnoses and all orders for this visit:  Lymphedema of both lower extremities -     CBC with Differential -     Comprehensive metabolic panel -     TSH -     Hemoglobin A1c -     Ambulatory referral to Physical Therapy  Urinary frequency -     POCT urinalysis  dipstick -     Cologuard  Acute UTI -     Urine culture  Bilateral cellulitis of lower leg -     cephALEXin (KEFLEX) 500 MG capsule; Take 1 capsule (500 mg total) by mouth 3 (three) times daily for 7 days.  Morbid obesity with BMI of 50.0-59.9, adult ([place])  Encounter to establish care    Patient Instructions       If you have  lab work done today you will be contacted with your lab results within the next 2 weeks.  If you have not heard from [place] then please contact us. The fastest way to get your results is to register for My Chart.   IF you received an x-ray today, you will receive an invoice from [facility]. Please contact [facility] at [phone] with questions or concerns regarding your invoice.   IF you received labwork today, you will receive an invoice from [facility]. Please contact [facility] at [phone] with questions or concerns regarding your invoice.   Our billing staff will not be able to assist you with questions regarding bills from these companies.  You will be contacted with the lab results as soon as they are available. The fastest way to get your results is to activate your My Chart account. Instructions are located on the last page of this paperwork. If you have not heard from [place] regarding the results in 2 weeks, please contact this office.     Edema  Edema is when you have too much fluid in your body or under your skin. Edema may make your legs, feet, and ankles swell up. Swelling is also common in looser tissues, like around your eyes. This is a common condition. It gets more common as you get older. There are many possible causes of edema. Eating too much salt (sodium) and being on your feet or sitting for a long time can cause edema in your legs, feet, and ankles. Hot weather may make edema worse. Edema is usually painless. Your skin may look swollen or shiny. Follow these instructions at home:  Keep the swollen body part raised  (elevated) above the level of your heart when you are sitting or lying down.  Do not sit still or stand for a long time.  Do not wear tight clothes. Do not wear garters on your upper legs.  Exercise your legs. This can help the swelling go down.  Wear elastic bandages or support stockings as told by your doctor.  Eat a low-salt (low-sodium) diet to reduce fluid as told by your doctor.  Depending on the cause of your swelling, you may need to limit how much fluid you drink (fluid restriction).  Take over-the-counter and prescription medicines only as told by your doctor. Contact a doctor if:  Treatment is not working.  You have heart, liver, or kidney disease and have symptoms of edema.  You have sudden and unexplained weight gain. Get help right away if:  You have shortness of breath or chest pain.  You cannot breathe when you lie down.  You have pain, redness, or warmth in the swollen areas.  You have heart, liver, or kidney disease and get edema all of a sudden.  You have a fever and your symptoms get worse all of a sudden. Summary  Edema is when you have too much fluid in your body or under your skin.  Edema may make your legs, feet, and ankles swell up. Swelling is also common in looser tissues, like around your eyes.  Raise (elevate) the swollen body part above the level of your heart when you are sitting or lying down.  Follow your doctor's instructions about diet and how much fluid you can drink (fluid restriction). This information is not intended to replace advice given to you by your health care provider. Make sure you discuss any questions you have with your health care provider. Document Revised: 12/23/2016 Document Reviewed: 01/08/2016 Elsevier  Patient Education  [employer].      Agustina Caroli, MD [facility]

## 2019-01-03 NOTE — Patient Instructions (Addendum)
° ° ° °If you have lab work done today you will be contacted with your lab results within the next 2 weeks.  If you have not heard from us then please contact us. The fastest way to get your results is to register for My Chart. ° ° °IF you received an x-ray today, you will receive an invoice from [facility]. Please contact [facility] at [phone] with questions or concerns regarding your invoice.  ° °IF you received labwork today, you will receive an invoice from [facility]. Please contact [facility] at [phone] with questions or concerns regarding your invoice.  ° °Our billing staff will not be able to assist you with questions regarding bills from these companies. ° °You will be contacted with the lab results as soon as they are available. The fastest way to get your results is to activate your My Chart account. Instructions are located on the last page of this paperwork. If you have not heard from us regarding the results in 2 weeks, please contact this office. °  ° ° °Edema ° °Edema is when you have too much fluid in your body or under your skin. Edema may make your legs, feet, and ankles swell up. Swelling is also common in looser tissues, like around your eyes. This is a common condition. It gets more common as you get older. There are many possible causes of edema. Eating too much salt (sodium) and being on your feet or sitting for a long time can cause edema in your legs, feet, and ankles. Hot weather may make edema worse. °Edema is usually painless. Your skin may look swollen or shiny. °Follow these instructions at home: °· Keep the swollen body part raised (elevated) above the level of your heart when you are sitting or lying down. °· Do not sit still or stand for a long time. °· Do not wear tight clothes. Do not wear garters on your upper legs. °· Exercise your legs. This can help the swelling go down. °· Wear elastic bandages or support stockings as told by your doctor. °· Eat a  low-salt (low-sodium) diet to reduce fluid as told by your doctor. °· Depending on the cause of your swelling, you may need to limit how much fluid you drink (fluid restriction). °· Take over-the-counter and prescription medicines only as told by your doctor. °Contact a doctor if: °· Treatment is not working. °· You have heart, liver, or kidney disease and have symptoms of edema. °· You have sudden and unexplained weight gain. °Get help right away if: °· You have shortness of breath or chest pain. °· You cannot breathe when you lie down. °· You have pain, redness, or warmth in the swollen areas. °· You have heart, liver, or kidney disease and get edema all of a sudden. °· You have a fever and your symptoms get worse all of a sudden. °Summary °· Edema is when you have too much fluid in your body or under your skin. °· Edema may make your legs, feet, and ankles swell up. Swelling is also common in looser tissues, like around your eyes. °· Raise (elevate) the swollen body part above the level of your heart when you are sitting or lying down. °· Follow your doctor's instructions about diet and how much fluid you can drink (fluid restriction). °This information is not intended to replace advice given to you by your health care provider. Make sure you discuss any questions you have with your health care provider. °Document   Revised: 12/23/2016 Document Reviewed: 01/08/2016 °Elsevier Patient Education © 2020 Elsevier Inc. ° °

## 2019-01-03 NOTE — Telephone Encounter (Signed)
Faxed Requisition for Cologuard to [facility]. Confirmation page 12:10 pm. Copy to PCP lab to log.

## 2019-01-04 LAB — COMPREHENSIVE METABOLIC PANEL
ALT: 14 IU/L (ref 0–32)
AST: 14 IU/L (ref 0–40)
Albumin/Globulin Ratio: 1.4 (ref 1.2–2.2)
Albumin: 4 g/dL (ref 3.8–4.9)
Alkaline Phosphatase: 88 IU/L (ref 39–117)
BUN/Creatinine Ratio: 21 (ref 9–23)
BUN: 14 mg/dL (ref 6–24)
Bilirubin Total: 0.4 mg/dL (ref 0.0–1.2)
CO2: 23 mmol/L (ref 20–29)
Calcium: 9 mg/dL (ref 8.7–10.2)
Chloride: 99 mmol/L (ref 96–106)
Creatinine, Ser: 0.68 mg/dL (ref 0.57–1.00)
GFR calc Af Amer: 117 mL/min/{1.73_m2} (ref >59)
GFR calc non Af Amer: 102 mL/min/{1.73_m2} (ref >59)
Globulin, Total: 2.8 g/dL (ref 1.5–4.5)
Glucose: 92 mg/dL (ref 65–99)
Potassium: 5.2 mmol/L (ref 3.5–5.2)
Sodium: 137 mmol/L (ref 134–144)
Total Protein: 6.8 g/dL (ref 6.0–8.5)

## 2019-01-04 LAB — CBC WITH DIFFERENTIAL/PLATELET
Basophils Absolute: 0 10*3/uL (ref 0.0–0.2)
Basos: 0 %
EOS (ABSOLUTE): 0.2 10*3/uL (ref 0.0–0.4)
Eos: 2 %
Hematocrit: 42.4 % (ref 34.0–46.6)
Hemoglobin: 14.3 g/dL (ref 11.1–15.9)
Immature Grans (Abs): 0 10*3/uL (ref 0.0–0.1)
Immature Granulocytes: 0 %
Lymphocytes Absolute: 3 10*3/uL (ref 0.7–3.1)
Lymphs: 32 %
MCH: 31.4 pg (ref 26.6–33.0)
MCHC: 33.7 g/dL (ref 31.5–35.7)
MCV: 93 fL (ref 79–97)
Monocytes Absolute: 0.8 10*3/uL (ref 0.1–0.9)
Monocytes: 9 %
Neutrophils Absolute: 5.3 10*3/uL (ref 1.4–7.0)
Neutrophils: 57 %
Platelets: 365 10*3/uL (ref 150–450)
RBC: 4.55 x10E6/uL (ref 3.77–5.28)
RDW: 12.6 % (ref 11.7–15.4)
WBC: 9.4 10*3/uL (ref 3.4–10.8)

## 2019-01-04 LAB — URINE CULTURE

## 2019-01-04 LAB — HEMOGLOBIN A1C
Est. average glucose Bld gHb Est-mCnc: 120 mg/dL
Hgb A1c MFr Bld: 5.8 % — ABNORMAL HIGH (ref 4.8–5.6)

## 2019-01-04 LAB — TSH: TSH: 3.01 u[IU]/mL (ref 0.450–4.500)

## 2019-01-11 ENCOUNTER — Telehealth: Payer: Self-pay | Admitting: Emergency Medicine

## 2019-01-11 NOTE — Telephone Encounter (Signed)
Copied from [place] [id number]. Topic: General - Other >> Jan 11, 2019 12:29 PM Yvette Rack wrote: Reason for CRM: Pt stated she needs a new Rx for furosemide (LASIX) 20 MG tablet to be sent to her pharmacy.

## 2019-01-11 NOTE — Telephone Encounter (Signed)
The pt was last seen on 12/06/18 for Edema. Pt is now requesting Lasix. He has not had this medication since 06/11/2015.

## 2019-01-13 ENCOUNTER — Other Ambulatory Visit: Payer: Self-pay | Admitting: Emergency Medicine

## 2019-01-13 MED ORDER — FUROSEMIDE 20 MG PO TABS: 20.0000 mg | ORAL_TABLET | Freq: Every day | ORAL | 2 refills | Status: DC | PRN

## 2019-01-13 NOTE — Telephone Encounter (Signed)
Prescription sent to pharmacy of record. Thanks, Guadelupe Sabin.

## 2019-01-16 ENCOUNTER — Telehealth: Payer: Self-pay | Admitting: Emergency Medicine

## 2019-01-16 NOTE — Telephone Encounter (Signed)
Pt called and requested an order for ssd. She reports that she has followed Dr's advice and her leg is still leaking. Pt requests call back to discuss. Number on chart was verified

## 2019-01-17 ENCOUNTER — Other Ambulatory Visit: Payer: Self-pay

## 2019-01-17 DIAGNOSIS — I890 Lymphedema, not elsewhere classified: Secondary | ICD-10-CM

## 2019-01-17 MED ORDER — SILVER SULFADIAZINE 1 % EX CREA: 1.0000 "application " | TOPICAL_CREAM | Freq: Every day | CUTANEOUS | 2 refills | Status: DC

## 2019-01-17 NOTE — Telephone Encounter (Signed)
Okay to prescribe for Silvadene.  Thanks.

## 2019-01-17 NOTE — Telephone Encounter (Signed)
Spoke with pt and she is requesting an RX for silvadene. Please Advise.

## 2019-02-02 LAB — EXTERNAL GENERIC LAB PROCEDURE: COLOGUARD: NEGATIVE

## 2019-02-04 NOTE — Telephone Encounter (Signed)
Spoke to patient to let her know Silvadene was sent to [facility] in [facility] on 01/17/2019. Per patient she did not get a call from them. Also, she will drop off the Handicap Placard for Dr Mitchel Honour to sign.

## 2019-02-04 NOTE — Telephone Encounter (Signed)
Pt states that she is still waiting on this cream and wants to know what is going on with that.   Pt also needs handicapped sticker done because she can't walk now.  Wants to know if she can drop that off at the office.

## 2019-02-05 LAB — COLOGUARD: Cologuard: NEGATIVE

## 2019-02-11 ENCOUNTER — Telehealth: Payer: Self-pay | Admitting: Emergency Medicine

## 2019-02-11 NOTE — Telephone Encounter (Signed)
Please advise when forms are ready for pick up

## 2019-02-11 NOTE — Telephone Encounter (Signed)
Pt. Dropped off medical certification for application & renewal of disability parking placard to be singed by Dr. Mitchel Honour, left in providers box at nurses station. Please advise.

## 2019-02-11 NOTE — Telephone Encounter (Signed)
FYI

## 2019-02-12 ENCOUNTER — Telehealth: Payer: Self-pay | Admitting: *Deleted

## 2019-02-12 NOTE — Telephone Encounter (Signed)
Spoke to patient the handicap placard is complete and ready to pick up at check-in. Copied to scan in the chart.

## 2019-04-20 ENCOUNTER — Telehealth: Payer: 59 | Admitting: Physician Assistant

## 2019-04-20 DIAGNOSIS — N390 Urinary tract infection, site not specified: Secondary | ICD-10-CM

## 2019-04-20 MED ORDER — CEPHALEXIN 500 MG PO CAPS: 500.0000 mg | ORAL_CAPSULE | Freq: Two times a day (BID) | ORAL | 0 refills | Status: DC

## 2019-04-20 NOTE — Progress Notes (Signed)
  I spoke with Mrs. Grodi on the phone, her back pain she is having is lower right and worse with touch, it does not appear to be flank or kidney related. She has no signs of pyelonephritis or complicated presentation at this time. We did discuss signs and symptoms of worsening infection and need for follow up evaluation.   We are sorry that you are not feeling well.  Here is how we plan to help!  Based on what you shared with me it looks like you most likely have a simple urinary tract infection.  A UTI (Urinary Tract Infection) is a bacterial infection of the bladder.  Most cases of urinary tract infections are simple to treat but a key part of your care is to encourage you to drink plenty of fluids and watch your symptoms carefully.  I have prescribed Keflex 500 mg twice a day for 7 days.  Your symptoms should gradually improve. Call us if the burning in your urine worsens, you develop worsening fever, back pain or pelvic pain or if your symptoms do not resolve after completing the antibiotic.  Urinary tract infections can be prevented by drinking plenty of water to keep your body hydrated.  Also be sure when you wipe, wipe from front to back and don't hold it in!  If possible, empty your bladder every 4 hours.  Your e-visit answers were reviewed by a board certified advanced clinical practitioner to complete your personal care plan.  Depending on the condition, your plan could have included both over the counter or prescription medications.  If there is a problem please reply  once you have received a response from your provider.  Your safety is important to [place].  If you have drug allergies check your prescription carefully.    You can use MyChart to ask questions about today's visit, request a non-urgent call back, or ask for a work or school excuse for 24 hours related to this e-Visit. If it has been greater than 24 hours you will need to follow up with your provider, or enter a new  e-Visit to address those concerns.   You will get an e-mail in the next two days asking about your experience.  I hope that your e-visit has been valuable and will speed your recovery. Thank you for using e-visits.  Greater than 5 minutes, yet less than 10 minutes of time have been spent researching, coordinating, and implementing care for this patient today

## 2019-05-28 ENCOUNTER — Encounter: Payer: Self-pay | Admitting: Emergency Medicine

## 2019-05-28 ENCOUNTER — Telehealth: Payer: Self-pay | Admitting: *Deleted

## 2019-05-28 ENCOUNTER — Other Ambulatory Visit: Payer: Self-pay

## 2019-05-28 ENCOUNTER — Ambulatory Visit (INDEPENDENT_AMBULATORY_CARE_PROVIDER_SITE_OTHER): Payer: No Typology Code available for payment source | Admitting: Emergency Medicine

## 2019-05-28 VITALS — BP 128/75 | HR 86 | Temp 98.2°F | Resp 16 | Ht 65.0 in | Wt 358.0 lb

## 2019-05-28 DIAGNOSIS — I890 Lymphedema, not elsewhere classified: Secondary | ICD-10-CM | POA: Diagnosis not present

## 2019-05-28 DIAGNOSIS — S92902A Unspecified fracture of left foot, initial encounter for closed fracture: Secondary | ICD-10-CM | POA: Diagnosis not present

## 2019-05-28 MED ORDER — FUROSEMIDE 20 MG PO TABS: 40.0000 mg | ORAL_TABLET | Freq: Every day | ORAL | 3 refills | Status: DC

## 2019-05-28 NOTE — Telephone Encounter (Signed)
Entered in error

## 2019-05-28 NOTE — Progress Notes (Signed)
Michele Ayers 54 y.o.   Chief Complaint  Patient presents with  . Fall    per pt on Thursday seen at [facility] [place]  . Foot Injury    LEFT on Thursday per pt needs referral  . Medication Refill    Furosemide    HISTORY OF PRESENT ILLNESS: [name] complaining of fracture left foot sustained last Thursday, 5 days ago.  Needs orthopedic referral. Also has history of chronic lymphedema of both lower extremities, needs medication refill, Lasix. No other complaints or medical concerns today Fully vaccinated against Covid.  HPI   Prior to Admission medications   Medication Sig Start Date End Date Taking? Authorizing Provider  furosemide (LASIX) 20 MG tablet Take 2 tablets (40 mg total) by mouth daily. 05/28/19 08/26/19 Yes Michele Ayers, Michele Bloomer, MD  cephALEXin (KEFLEX) 500 MG capsule Take 1 capsule (500 mg total) by mouth 2 (two) times daily. Patient not taking: Reported on 05/28/2019 04/20/19   Michele Ayers, Michele Filbert, PA-C  silver sulfADIAZINE (SILVADENE) 1 % cream Apply 1 application topically daily. 01/17/19   Michele Pollen, MD    No Known Allergies  Patient Active Problem List   Diagnosis Date Noted  . Lymphedema of both lower extremities 01/03/2019  . Morbid obesity with BMI of 50.0-59.9, adult ([place]) 01/03/2019  . Bilateral cellulitis of lower leg 01/03/2019    History reviewed. No pertinent past medical history.  Past Surgical History:  Procedure Laterality Date  . TUBAL LIGATION      Social History   Socioeconomic History  . Marital status: Single    Spouse name: Not on file  . Number of children: Not on file  . Years of education: Not on file  . Highest education level: Not on file  Occupational History  . Not on file  Tobacco Use  . Smoking status: Former Smoker    Types: Cigarettes  . Smokeless tobacco: Never Used  . Tobacco comment: per pt stopped a month ago only smoked a pack in  3 days  Substance and Sexual  Activity  . Alcohol use: No  . Drug use: No  . Sexual activity: Not on file  Other Topics Concern  . Not on file  Social History Narrative  . Not on file   Social Determinants of Health   Financial Resource Strain:   . Difficulty of Paying Living Expenses:   Food Insecurity:   . Worried About Charity fundraiser in the Last Year:   . Arboriculturist in the Last Year:   Transportation Needs:   . Film/video editor (Medical):   Marland Kitchen Lack of Transportation (Non-Medical):   Physical Activity:   . Days of Exercise per Week:   . Minutes of Exercise per Session:   Stress:   . Feeling of Stress :   Social Connections:   . Frequency of Communication with Friends and Family:   . Frequency of Social Gatherings with Friends and Family:   . Attends Religious Services:   . Active Member of Clubs or Organizations:   . Attends Archivist Meetings:   Marland Kitchen Marital Status:   Intimate Partner Violence:   . Fear of Current or Ex-Partner:   . Emotionally Abused:   Marland Kitchen Physically Abused:   . Sexually Abused:     History reviewed. No pertinent family history.   Review of Systems  Constitutional: Negative.  Negative for chills and fever.  HENT: Negative.  Negative for congestion and sore throat.   Respiratory: Negative.  Negative for cough and shortness of breath.   Cardiovascular: Positive for leg swelling (Chronic bilateral legs). Negative for chest pain and palpitations.  Gastrointestinal: Negative.  Negative for abdominal pain, diarrhea, nausea and vomiting.  Genitourinary: Negative.   Musculoskeletal:       Pain to left foot  Skin: Negative.  Negative for rash.  Neurological: Negative for dizziness and headaches.  All other systems reviewed and are negative.  Today's Vitals   05/28/19 1011  BP: 128/75  Pulse: 86  Resp: 16  Temp: 98.2 F (36.8 C)  TempSrc: Temporal  SpO2: 94%  Weight: (!) 358 lb (162.4 kg)  Height: 5\' 5"  (1.651 m)   Body mass index is 59.57  kg/m.   Physical Exam Vitals reviewed.  Constitutional:      Appearance: Normal appearance. She is obese.  HENT:     Head: Normocephalic.  Eyes:     Extraocular Movements: Extraocular movements intact.     Pupils: Pupils are equal, round, and reactive to light.  Cardiovascular:     Rate and Rhythm: Normal rate.  Pulmonary:     Effort: Pulmonary effort is normal.  Musculoskeletal:     Cervical back: Normal range of motion.     Right lower leg: Edema present.     Left lower leg: Edema present.     Comments: Left foot: Positive swelling and distal ecchymosis.  Good distal capillary refill.  Skin:    General: Skin is warm and dry.     Capillary Refill: Capillary refill takes less than 2 seconds.  Neurological:     General: No focal deficit present.     Mental Status: She is alert and oriented to person, place, and time.      ASSESSMENT & PLAN: [name] was seen today for fall, foot injury and medication refill.  Diagnoses and all orders for this visit:  Closed fracture of left foot, initial encounter -     Ambulatory referral to Orthopedic Surgery  Lymphedema of both lower extremities -     furosemide (LASIX) 20 MG tablet; Take 2 tablets (40 mg total) by mouth daily.    Patient Instructions       If you have lab work done today you will be contacted with your lab results within the next 2 weeks.  If you have not heard from [place] then please contact us. The fastest way to get your results is to register for My Chart.   IF you received an x-ray today, you will receive an invoice from [facility]. Please contact [facility] at [phone] with questions or concerns regarding your invoice.   IF you received labwork today, you will receive an invoice from [place]. Please contact [facility] at [phone] with questions or concerns regarding your invoice.   Our billing staff will not be able to assist you with questions regarding bills from these  companies.  You will be contacted with the lab results as soon as they are available. The fastest way to get your results is to activate your My Chart account. Instructions are located on the last page of this paperwork. If you have not heard from [place] regarding the results in 2 weeks, please contact this office.     Metatarsal Fracture A metatarsal fracture is a break in one of the five bones that connect the toes to the rest of the foot. This may also be called a forefoot fracture. A metatarsal  fracture may be:  A crack in the surface of the bone (stress fracture). This often occurs in athletes.  A break all the way through the bone (complete fracture). The bone that connects to the little toe (fifth metatarsal) is most commonly fractured. Ballet dancers often fracture this bone. What are the causes? A metatarsal fracture may be caused by:  Sudden twisting of the foot.  Falling onto the foot.  Something heavy falling onto the foot.  Overuse or repetitive exercise. What increases the risk? This condition is more likely to develop in people who:  Play contact sports.  Do ballet.  Have a condition that causes the bones to become thin and brittle (osteoporosis).  Have a low calcium level. What are the signs or symptoms? Symptoms of this condition include:  Pain that gets worse when walking or standing.  Pain when pressing on the foot or moving the toes.  Swelling.  Bruising on the top or bottom of the foot. How is this diagnosed? This condition may be diagnosed based on:  Your symptoms.  Any recent foot injuries you have had.  A physical exam.  An X-ray of your foot. If you have a stress fracture, it may not show up on an X-ray, and you may need other imaging tests, such as: ? A bone scan. ? CT scan. ? MRI. How is this treated? Treatment depends on how severe your fracture is and how the pieces of the broken bone line up with each other (alignment). Treatment may  involve:  Wearing a cast, splint, or supportive boot on your foot.  Using crutches, and not putting any weight on your foot.  Having surgery to align broken bones (open reduction and internal fixation, ORIF).  Physical therapy.  Follow-up visits and X-rays to make sure you are healing. Follow these instructions at home: If you have a splint or a supportive boot:  Wear the splint or boot as told by your health care provider. Remove it only as told by your health care provider.  Loosen the splint or boot if your toes tingle, become numb, or turn cold and blue.  Keep the splint or boot clean.  If your splint or boot is not waterproof: ? Do not let it get wet. ? Cover it with a watertight covering when you take a bath or a shower. If you have a cast:  Do not stick anything inside the cast to scratch your skin. Doing that increases your risk for infection.  Check the skin around the cast every day. Tell your health care provider about any concerns.  You may put lotion on dry skin around the edges of the cast. Do not put lotion on the skin underneath the cast.  Keep the cast clean.  If the cast is not waterproof: ? Do not let it get wet. ? Cover it with a watertight covering when you take a bath or a shower. Activity  Do not use your affected leg to support your body weight until your health care provider says that you can. Use crutches as directed.  Ask your health care provider what activities are safe for you during recovery, and ask what activities you need to avoid.  Do physical therapy exercises as directed. Driving  Do not drive or use heavy machinery while taking pain medicine.  Do not drive while wearing a cast, splint, or boot on a foot that you use for driving. Managing pain, stiffness, and swelling   If directed, put ice  on painful areas: ? Put ice in a plastic bag. ? Place a towel between your skin and the bag.  If you have a removable splint or boot,  remove it as told by your health care provider.  If you have a cast, place a towel between your cast and the bag. ? Leave the ice on for 20 minutes, 2-3 times a day.  Move your toes often to avoid stiffness and to lessen swelling.  Raise (elevate) your lower leg above the level of your heart while you are sitting or lying down. General instructions  Do not put pressure on any part of the cast or splint until it is fully hardened. This may take several hours.  Take over-the-counter and prescription medicines only as told by your health care provider.  Do not use any products that contain nicotine or tobacco, such as cigarettes and e-cigarettes. These can delay bone healing. If you need help quitting, ask your health care provider.  Do not take baths, swim, or use a hot tub until your health care provider approves. Ask your health care provider if you may take showers.  Keep all follow-up visits as told by your health care provider. This is important. Contact a health care provider if you have:  Pain that gets worse or does not get better with medicine.  A fever.  A bad smell coming from your cast or splint. Get help right away if you have:  Any of the following in your toes or your foot, even after loosening your splint (if applicable): ? Numbness. ? Tingling. ? Coldness. ? Blue skin.  Redness or swelling that gets worse.  Pain that suddenly becomes severe. Summary  A metatarsal fracture is a break in one of the five bones that connect the toes to the rest of the foot.  Treatment depends on how severe your fracture is and how the pieces of the broken bone line up with each other (alignment). This may include wearing a cast, splint, or supportive boot, or using crutches. Sometimes surgery is needed to align the bones.  Ice and elevate your foot to help lessen the pain and swelling.  Make sure you know what symptoms should cause you to get help right away. This information  is not intended to replace advice given to you by your health care provider. Make sure you discuss any questions you have with your health care provider. Document Revised: 04/12/2018 Document Reviewed: 01/16/2017 Elsevier Patient Education  2020 Elsevier Inc.      Agustina Caroli, MD [facility]

## 2019-05-28 NOTE — Patient Instructions (Addendum)
If you have lab work done today you will be contacted with your lab results within the next 2 weeks.  If you have not heard from [place] then please contact us. The fastest way to get your results is to register for My Chart.   IF you received an x-ray today, you will receive an invoice from [facility]. Please contact [facility] at [phone] with questions or concerns regarding your invoice.   IF you received labwork today, you will receive an invoice from [place]. Please contact [facility] at [phone] with questions or concerns regarding your invoice.   Our billing staff will not be able to assist you with questions regarding bills from these companies.  You will be contacted with the lab results as soon as they are available. The fastest way to get your results is to activate your My Chart account. Instructions are located on the last page of this paperwork. If you have not heard from [place] regarding the results in 2 weeks, please contact this office.     Metatarsal Fracture A metatarsal fracture is a break in one of the five bones that connect the toes to the rest of the foot. This may also be called a forefoot fracture. A metatarsal fracture may be:  A crack in the surface of the bone (stress fracture). This often occurs in athletes.  A break all the way through the bone (complete fracture). The bone that connects to the little toe (fifth metatarsal) is most commonly fractured. Ballet dancers often fracture this bone. What are the causes? A metatarsal fracture may be caused by:  Sudden twisting of the foot.  Falling onto the foot.  Something heavy falling onto the foot.  Overuse or repetitive exercise. What increases the risk? This condition is more likely to develop in people who:  Play contact sports.  Do ballet.  Have a condition that causes the bones to become thin and brittle (osteoporosis).  Have a low calcium level. What are the signs or  symptoms? Symptoms of this condition include:  Pain that gets worse when walking or standing.  Pain when pressing on the foot or moving the toes.  Swelling.  Bruising on the top or bottom of the foot. How is this diagnosed? This condition may be diagnosed based on:  Your symptoms.  Any recent foot injuries you have had.  A physical exam.  An X-ray of your foot. If you have a stress fracture, it may not show up on an X-ray, and you may need other imaging tests, such as: ? A bone scan. ? CT scan. ? MRI. How is this treated? Treatment depends on how severe your fracture is and how the pieces of the broken bone line up with each other (alignment). Treatment may involve:  Wearing a cast, splint, or supportive boot on your foot.  Using crutches, and not putting any weight on your foot.  Having surgery to align broken bones (open reduction and internal fixation, ORIF).  Physical therapy.  Follow-up visits and X-rays to make sure you are healing. Follow these instructions at home: If you have a splint or a supportive boot:  Wear the splint or boot as told by your health care provider. Remove it only as told by your health care provider.  Loosen the splint or boot if your toes tingle, become numb, or turn cold and blue.  Keep the splint or boot clean.  If your splint or boot is not waterproof: ? Do not  let it get wet. ? Cover it with a watertight covering when you take a bath or a shower. If you have a cast:  Do not stick anything inside the cast to scratch your skin. Doing that increases your risk for infection.  Check the skin around the cast every day. Tell your health care provider about any concerns.  You may put lotion on dry skin around the edges of the cast. Do not put lotion on the skin underneath the cast.  Keep the cast clean.  If the cast is not waterproof: ? Do not let it get wet. ? Cover it with a watertight covering when you take a bath or a  shower. Activity  Do not use your affected leg to support your body weight until your health care provider says that you can. Use crutches as directed.  Ask your health care provider what activities are safe for you during recovery, and ask what activities you need to avoid.  Do physical therapy exercises as directed. Driving  Do not drive or use heavy machinery while taking pain medicine.  Do not drive while wearing a cast, splint, or boot on a foot that you use for driving. Managing pain, stiffness, and swelling   If directed, put ice on painful areas: ? Put ice in a plastic bag. ? Place a towel between your skin and the bag.  If you have a removable splint or boot, remove it as told by your health care provider.  If you have a cast, place a towel between your cast and the bag. ? Leave the ice on for 20 minutes, 2-3 times a day.  Move your toes often to avoid stiffness and to lessen swelling.  Raise (elevate) your lower leg above the level of your heart while you are sitting or lying down. General instructions  Do not put pressure on any part of the cast or splint until it is fully hardened. This may take several hours.  Take over-the-counter and prescription medicines only as told by your health care provider.  Do not use any products that contain nicotine or tobacco, such as cigarettes and e-cigarettes. These can delay bone healing. If you need help quitting, ask your health care provider.  Do not take baths, swim, or use a hot tub until your health care provider approves. Ask your health care provider if you may take showers.  Keep all follow-up visits as told by your health care provider. This is important. Contact a health care provider if you have:  Pain that gets worse or does not get better with medicine.  A fever.  A bad smell coming from your cast or splint. Get help right away if you have:  Any of the following in your toes or your foot, even after  loosening your splint (if applicable): ? Numbness. ? Tingling. ? Coldness. ? Blue skin.  Redness or swelling that gets worse.  Pain that suddenly becomes severe. Summary  A metatarsal fracture is a break in one of the five bones that connect the toes to the rest of the foot.  Treatment depends on how severe your fracture is and how the pieces of the broken bone line up with each other (alignment). This may include wearing a cast, splint, or supportive boot, or using crutches. Sometimes surgery is needed to align the bones.  Ice and elevate your foot to help lessen the pain and swelling.  Make sure you know what symptoms should cause you to get help  right away. This information is not intended to replace advice given to you by your health care provider. Make sure you discuss any questions you have with your health care provider. Document Revised: 04/12/2018 Document Reviewed: 01/16/2017 Elsevier Patient Education  [place].

## 2019-05-30 ENCOUNTER — Ambulatory Visit: Payer: Self-pay | Admitting: Family Medicine

## 2019-05-30 ENCOUNTER — Encounter: Payer: Self-pay | Admitting: Orthopedic Surgery

## 2019-05-30 ENCOUNTER — Other Ambulatory Visit: Payer: Self-pay

## 2019-05-30 ENCOUNTER — Ambulatory Visit (INDEPENDENT_AMBULATORY_CARE_PROVIDER_SITE_OTHER): Payer: Worker's Compensation | Admitting: Orthopedic Surgery

## 2019-05-30 VITALS — Ht 65.0 in | Wt 358.0 lb

## 2019-05-30 DIAGNOSIS — S92352A Displaced fracture of fifth metatarsal bone, left foot, initial encounter for closed fracture: Secondary | ICD-10-CM

## 2019-05-31 ENCOUNTER — Encounter: Payer: Self-pay | Admitting: Orthopedic Surgery

## 2019-05-31 NOTE — Progress Notes (Signed)
Office Visit Note   Patient: Michele Ayers           Date of Birth: 1965-08-15           MRN: [id number] Visit Date: 05/30/2019              Requested by: Horald Pollen, MD [place],   [id number] PCP: Horald Pollen, MD  Chief Complaint  Patient presents with  . Left Foot - Pain    DOI 05/23/19      HPI: Patient is a 54 year old woman who was seen for initial evaluation for left foot metatarsal neck fracture.  Patient states that she was at work got up from a seated position and her foot folded under her she states she did not feel a pop she states she started having pain 2 days later.  Assessment & Plan: Visit Diagnoses:  1. Closed fracture of fifth metatarsal bone of left foot, initial encounter     Plan: We will give her a postoperative shoe recommended protected weightbearing reevaluate in the office in 4 weeks with repeat x-ray three-view left foot  Patient was given a note for light duty work for 4 weeks with no climbing stairs maximum lifting 10 pounds.  Follow-Up Instructions: Return in about 4 weeks (around 06/27/2019).   Ortho Exam  Patient is alert, oriented, no adenopathy, well-dressed, normal affect, normal respiratory effort. Examination patient has good pulses she does have massive venous stasis swelling with brawny edema in her leg but no venous ulcers.  Radiographs were reviewed on the disc which shows a nondisplaced fracture of the neck of the fifth met at tarsal.  Imaging: No results found. No images are attached to the encounter.  Labs: Lab Results  Component Value Date   HGBA1C 5.8 (H) 01/03/2019   REPTSTATUS 05/22/2008 FINAL 05/20/2008   CULT NO GROWTH 05/20/2008     Lab Results  Component Value Date   ALBUMIN 4.0 01/03/2019   ALBUMIN 3.9 06/11/2015   ALBUMIN 4.4 12/01/2008    No results found for: MG No results found for: VD25OH  No results found for: PREALBUMIN CBC EXTENDED Latest Ref Rng & Units  01/03/2019 06/11/2015 03/23/2015  WBC 3.4 - 10.8 x10E3/uL 9.4 10.7(H) 10.8(H)  RBC 3.77 - 5.28 x10E6/uL 4.55 4.07 4.18  HGB 11.1 - 15.9 g/dL 14.3 13.2 13.4  HCT 34.0 - 46.6 % 42.4 39.7 40.5  PLT 150 - 450 x10E3/uL 365 337 324  NEUTROABS 1.4 - 7.0 x10E3/uL 5.3 6.4 6.0  LYMPHSABS 0.7 - 3.1 x10E3/uL 3.0 3.4 4.0     Body mass index is 59.57 kg/m.  Orders:  No orders of the defined types were placed in this encounter.  No orders of the defined types were placed in this encounter.    Procedures: No procedures performed  Clinical Data: No additional findings.  ROS:  All other systems negative, except as noted in the HPI. Review of Systems  Objective: Vital Signs: Ht 5' 5"  (1.651 m)   Wt (!) 358 lb (162.4 kg)   BMI 59.57 kg/m   Specialty Comments:  No specialty comments available.  PMFS History: Patient Active Problem List   Diagnosis Date Noted  . Lymphedema of both lower extremities 01/03/2019  . Morbid obesity with BMI of 50.0-59.9, adult ([place]) 01/03/2019  . Bilateral cellulitis of lower leg 01/03/2019   History reviewed. No pertinent past medical history.  History reviewed. No pertinent family history.  Past Surgical History:  Procedure Laterality Date  .  TUBAL LIGATION     Social History   Occupational History  . Not on file  Tobacco Use  . Smoking status: Former Smoker    Types: Cigarettes  . Smokeless tobacco: Never Used  . Tobacco comment: per pt stopped a month ago only smoked a pack in  3 days  Substance and Sexual Activity  . Alcohol use: No  . Drug use: No  . Sexual activity: Not on file

## 2019-06-13 ENCOUNTER — Ambulatory Visit: Payer: Self-pay

## 2019-06-27 ENCOUNTER — Encounter: Payer: Self-pay | Admitting: Orthopedic Surgery

## 2019-06-27 ENCOUNTER — Ambulatory Visit: Payer: Self-pay

## 2019-06-27 ENCOUNTER — Other Ambulatory Visit: Payer: Self-pay

## 2019-06-27 ENCOUNTER — Ambulatory Visit (INDEPENDENT_AMBULATORY_CARE_PROVIDER_SITE_OTHER): Payer: Worker's Compensation | Admitting: Physician Assistant

## 2019-06-27 DIAGNOSIS — S92352A Displaced fracture of fifth metatarsal bone, left foot, initial encounter for closed fracture: Secondary | ICD-10-CM

## 2019-06-27 NOTE — Progress Notes (Signed)
Office Visit Note   Patient: Michele Ayers           Date of Birth: 1965-10-04           MRN: [id number] Visit Date: 06/27/2019              Requested by: Horald Pollen, MD [place],  [place] [id number] PCP: Horald Pollen, MD  Chief Complaint  Patient presents with  . Left Foot - Follow-up      HPI: Patient presents today 1 month status post left foot fifth metatarsal neck fracture she sustained at work.  She has been wearing a postop shoe.  She still has some pain along the bottom of her foot.  She does feel she is improved  Assessment & Plan: Visit Diagnoses:  1. Closed fracture of fifth metatarsal bone of left foot, initial encounter     Plan: Follow-up in 3 weeks.  Her work restrictions will remain the same   Follow-Up Instructions: No follow-ups on file.   Ortho Exam  Patient is alert, oriented, no adenopathy, well-dressed, normal affect, normal respiratory effort. Left foot mildly tender to deep palpation along the plantar surface of the lateral side of the foot she does have bilateral lymphedema.  That extends down into both of her feet but no cellulitis.  Imaging: No results found. No images are attached to the encounter.  Labs: Lab Results  Component Value Date   HGBA1C 5.8 (H) 01/03/2019   REPTSTATUS 05/22/2008 FINAL 05/20/2008   CULT NO GROWTH 05/20/2008     Lab Results  Component Value Date   ALBUMIN 4.0 01/03/2019   ALBUMIN 3.9 06/11/2015   ALBUMIN 4.4 12/01/2008    No results found for: MG No results found for: VD25OH  No results found for: PREALBUMIN CBC EXTENDED Latest Ref Rng & Units 01/03/2019 06/11/2015 03/23/2015  WBC 3.4 - 10.8 x10E3/uL 9.4 10.7(H) 10.8(H)  RBC 3.77 - 5.28 x10E6/uL 4.55 4.07 4.18  HGB 11.1 - 15.9 g/dL 14.3 13.2 13.4  HCT 34.0 - 46.6 % 42.4 39.7 40.5  PLT 150 - 450 x10E3/uL 365 337 324  NEUTROABS 1 - 7 x10E3/uL 5.3 6.4 6.0  LYMPHSABS 0 - 3 x10E3/uL 3.0 3.4 4.0     There is no height or  weight on file to calculate BMI.  Orders:  Orders Placed This Encounter  Procedures  . XR Foot Complete Left   No orders of the defined types were placed in this encounter.    Procedures: No procedures performed  Clinical Data: No additional findings.  ROS:  All other systems negative, except as noted in the HPI. Review of Systems  Objective: Vital Signs: There were no vitals taken for this visit.  Specialty Comments:  No specialty comments available.  PMFS History: Patient Active Problem List   Diagnosis Date Noted  . Lymphedema of both lower extremities 01/03/2019  . Morbid obesity with BMI of 50.0-59.9, adult ([place]) 01/03/2019  . Bilateral cellulitis of lower leg 01/03/2019   No past medical history on file.  No family history on file.  Past Surgical History:  Procedure Laterality Date  . TUBAL LIGATION     Social History   Occupational History  . Not on file  Tobacco Use  . Smoking status: Former Smoker    Types: Cigarettes  . Smokeless tobacco: Never Used  . Tobacco comment: per pt stopped a month ago only smoked a pack in  3 days  Substance and Sexual Activity  .  Alcohol use: No  . Drug use: No  . Sexual activity: Not on file

## 2019-07-12 ENCOUNTER — Ambulatory Visit: Payer: Self-pay

## 2019-07-12 NOTE — Telephone Encounter (Signed)
Pt has made an appointment to be seen by Dr Mitchel Honour next week

## 2019-07-12 NOTE — Telephone Encounter (Signed)
Pt. Report she has had a menstrual period x 3 weeks. Has heavy bleeding and seeing "small clots." Also has pain at the "base of my spine when I stand up.I can't stand very long." Requests an appointment . Warm transfer to [facility] in the practice.  Answer Assessment - Initial Assessment Questions 1. AMOUNT: "Describe the bleeding that you are having."    - SPOTTING: spotting, or pinkish / brownish mucous discharge; does not fill panti-liner or pad    - MILD:  less than 1 pad / hour; less than patient's usual menstrual bleeding   - MODERATE: 1-2 pads / hour; 1 menstrual cup every 6 hours; small-medium blood clots (e.g., pea, grape, small coin)   - SEVERE: soaking 2 or more pads/hour for 2 or more hours; 1 menstrual cup every 2 hours; bleeding not contained by pads or continuous red blood from vagina; large blood clots (e.g., golf ball, large coin)      Small clots and heavy bleeding 2. ONSET: "When did the bleeding begin?" "Is it continuing now?"     2 weeks 3. MENSTRUAL PERIOD: "When was the last normal menstrual period?" "How is this different than your period?"     Irregular 4. REGULARITY: "How regular are your periods?"     Every 3 months 5. ABDOMINAL PAIN: "Do you have any pain?" "How bad is the pain?"  (e.g., Scale 1-10; mild, moderate, or severe)   - MILD (1-3): doesn't interfere with normal activities, abdomen soft and not tender to touch    - MODERATE (4-7): interferes with normal activities or awakens from sleep, tender to touch    - SEVERE (8-10): excruciating pain, doubled over, unable to do any normal activities      With standing 6. PREGNANCY: "Could you be pregnant?" "Are you sexually active?" "Did you recently give birth?"     No 7. BREASTFEEDING: "Are you breastfeeding?"     No 8. HORMONES: "Are you taking any hormone medications, prescription or OTC?" (e.g., birth control pills, estrogen)     No 9. BLOOD THINNERS: "Do you take any blood thinners?" (e.g., Coumadin/warfarin,  Pradaxa/dabigatran, aspirin)     Asa 10. CAUSE: "What do you think is causing the bleeding?" (e.g., recent gyn surgery, recent gyn procedure; known bleeding disorder, cervical cancer, polycystic ovarian disease, fibroids)         Unsure 11. HEMODYNAMIC STATUS: "Are you weak or feeling lightheaded?" If Yes, ask: "Can you stand and walk normally?"        Tired 12. OTHER SYMPTOMS: "What other symptoms are you having with the bleeding?" (e.g., passed tissue, vaginal discharge, fever, menstrual-type cramps)       Cramping, tired  Protocols used: VAGINAL BLEEDING - ABNORMAL-A-AH

## 2019-07-15 ENCOUNTER — Other Ambulatory Visit: Payer: Self-pay

## 2019-07-15 ENCOUNTER — Telehealth: Payer: Self-pay | Admitting: Emergency Medicine

## 2019-07-15 ENCOUNTER — Ambulatory Visit (INDEPENDENT_AMBULATORY_CARE_PROVIDER_SITE_OTHER): Payer: No Typology Code available for payment source | Admitting: Emergency Medicine

## 2019-07-15 ENCOUNTER — Encounter: Payer: Self-pay | Admitting: Emergency Medicine

## 2019-07-15 VITALS — BP 112/74 | HR 83 | Temp 98.1°F | Resp 16 | Ht 64.0 in | Wt 358.0 lb

## 2019-07-15 DIAGNOSIS — L723 Sebaceous cyst: Secondary | ICD-10-CM | POA: Diagnosis not present

## 2019-07-15 DIAGNOSIS — I890 Lymphedema, not elsewhere classified: Secondary | ICD-10-CM | POA: Diagnosis not present

## 2019-07-15 DIAGNOSIS — N939 Abnormal uterine and vaginal bleeding, unspecified: Secondary | ICD-10-CM

## 2019-07-15 DIAGNOSIS — Z6841 Body Mass Index (BMI) 40.0 and over, adult: Secondary | ICD-10-CM | POA: Diagnosis not present

## 2019-07-15 DIAGNOSIS — N951 Menopausal and female climacteric states: Secondary | ICD-10-CM

## 2019-07-15 LAB — POCT CBC
Granulocyte percent: 67.2 %G (ref 37–80)
HCT, POC: 41.1 % — AB (ref 29–41)
Hemoglobin: 13.5 g/dL (ref 11–14.6)
Lymph, poc: 26 — AB (ref 0.6–3.4)
MCH, POC: 31.3 pg — AB (ref 27–31.2)
MCHC: 32.8 g/dL (ref 31.8–35.4)
MCV: 95.6 fL (ref 76–111)
MID (cbc): 0.4 (ref 0–0.9)
MPV: 7.9 fL (ref 0–99.8)
POC Granulocyte: 6.1 (ref 2–6.9)
POC LYMPH PERCENT: 28.4 %L (ref 10–50)
POC MID %: 4.4 %M (ref 0–12)
Platelet Count, POC: 339 10*3/uL (ref 142–424)
RBC: 4.3 M/uL (ref 4.04–5.48)
RDW, POC: 14.3 %
WBC: 9.1 10*3/uL (ref 4.6–10.2)

## 2019-07-15 LAB — LIPID PANEL
Chol/HDL Ratio: 5.1 ratio — ABNORMAL HIGH (ref 0.0–4.4)
Cholesterol, Total: 184 mg/dL (ref 100–199)
HDL: 36 mg/dL — ABNORMAL LOW (ref >39)
LDL Chol Calc (NIH): 133 mg/dL — ABNORMAL HIGH (ref 0–99)
Triglycerides: 82 mg/dL (ref 0–149)
VLDL Cholesterol Cal: 15 mg/dL (ref 5–40)

## 2019-07-15 MED ORDER — SILVER SULFADIAZINE 1 % EX CREA: 1.0000 "application " | TOPICAL_CREAM | Freq: Every day | CUTANEOUS | 2 refills | Status: DC

## 2019-07-15 NOTE — Progress Notes (Signed)
Michele Ayers 54 y.o.   Chief Complaint  Patient presents with  . Menstrual Problem    per patient her period has been on for 2 weeks with clotting  . Mass    on back it was noticed about 2 weeks ago and another problem standing    HISTORY OF PRESENT ILLNESS: This is a 54 y.o. female complaining of daily vaginal bleeding for the past 2 weeks. Denies abdominal or pelvic pain. Denies lightheadedness or syncope.  Denies chest pain or difficulty breathing. Has history of chronic lymphedema. History of fracture left foot 6 weeks ago. Also noticed a lump on the back last 2 weeks. Fully vaccinated against Covid. No other complaints or medical concerns.  HPI   Prior to Admission medications   Medication Sig Start Date End Date Taking? Authorizing Provider  cephALEXin (KEFLEX) 500 MG capsule Take 1 capsule (500 mg total) by mouth 2 (two) times daily. 04/20/19   Hedges, Dellis Filbert, PA-C  furosemide (LASIX) 20 MG tablet Take 2 tablets (40 mg total) by mouth daily. 05/28/19 08/26/19  Horald Pollen, MD  silver sulfADIAZINE (SILVADENE) 1 % cream Apply 1 application topically daily. 01/17/19   Horald Pollen, MD    No Known Allergies  Patient Active Problem List   Diagnosis Date Noted  . Lymphedema of both lower extremities 01/03/2019  . Morbid obesity with BMI of 50.0-59.9, adult ([place]) 01/03/2019    No past medical history on file.  Past Surgical History:  Procedure Laterality Date  . TUBAL LIGATION      Social History   Socioeconomic History  . Marital status: Single    Spouse name: Not on file  . Number of children: Not on file  . Years of education: Not on file  . Highest education level: Not on file  Occupational History  . Not on file  Tobacco Use  . Smoking status: Former Smoker    Types: Cigarettes  . Smokeless tobacco: Never Used  . Tobacco comment: per pt stopped a month ago only smoked a pack in  3 days  Substance and Sexual Activity  . Alcohol use:  No  . Drug use: No  . Sexual activity: Not on file  Other Topics Concern  . Not on file  Social History Narrative  . Not on file   Social Determinants of Health   Financial Resource Strain:   . Difficulty of Paying Living Expenses:   Food Insecurity:   . Worried About Charity fundraiser in the Last Year:   . Arboriculturist in the Last Year:   Transportation Needs:   . Film/video editor (Medical):   Marland Kitchen Lack of Transportation (Non-Medical):   Physical Activity:   . Days of Exercise per Week:   . Minutes of Exercise per Session:   Stress:   . Feeling of Stress :   Social Connections:   . Frequency of Communication with Friends and Family:   . Frequency of Social Gatherings with Friends and Family:   . Attends Religious Services:   . Active Member of Clubs or Organizations:   . Attends Archivist Meetings:   Marland Kitchen Marital Status:   Intimate Partner Violence:   . Fear of Current or Ex-Partner:   . Emotionally Abused:   Marland Kitchen Physically Abused:   . Sexually Abused:     No family history on file.   Review of Systems  Constitutional: Negative.  Negative for chills and fever.  HENT: Negative.  Negative for congestion and sore throat.   Respiratory: Negative.  Negative for cough and shortness of breath.   Cardiovascular: Negative.  Negative for chest pain and palpitations.  Gastrointestinal: Negative.  Negative for abdominal pain, diarrhea, nausea and vomiting.  Genitourinary: Negative.  Negative for dysuria and hematuria.  Skin: Negative.  Negative for rash.  Neurological: Negative.  Negative for dizziness and headaches.  All other systems reviewed and are negative.  Today's Vitals   07/15/19 0920  BP: 112/74  Pulse: 83  Resp: 16  Temp: 98.1 F (36.7 C)  TempSrc: Temporal  SpO2: 96%  Weight: (!) 358 lb (162.4 kg)  Height: 5\' 4"  (1.626 m)   Body mass index is 61.45 kg/m.   Physical Exam Vitals reviewed.  Constitutional:      Appearance: She is  obese.  HENT:     Head: Normocephalic.  Eyes:     Extraocular Movements: Extraocular movements intact.     Pupils: Pupils are equal, round, and reactive to light.  Cardiovascular:     Rate and Rhythm: Normal rate and regular rhythm.     Pulses: Normal pulses.     Heart sounds: Normal heart sounds.  Pulmonary:     Effort: Pulmonary effort is normal.     Breath sounds: Normal breath sounds.  Musculoskeletal:     Cervical back: Normal range of motion.     Right lower leg: Edema present.     Left lower leg: Edema present.  Skin:    General: Skin is warm and dry.     Capillary Refill: Capillary refill takes less than 2 seconds.     Comments: Sebaceous cyst to left lower lumbar area.  No infection.  Neurological:     General: No focal deficit present.     Mental Status: She is alert and oriented to person, place, and time.  Psychiatric:        Mood and Affect: Mood normal.        Behavior: Behavior normal.      Results for orders placed or performed in visit on 07/15/19 (from the past 24 hour(s))  POCT CBC     Status: Abnormal   Collection Time: 07/15/19  9:43 AM  Result Value Ref Range   WBC 9.1 4.6 - 10.2 K/uL   Lymph, poc 26.0 (A) 0.6 - 3.4   POC LYMPH PERCENT 28.4 10 - 50 %L   MID (cbc) 0.4 0 - 0.9   POC MID % 4.4 0 - 12 %M   POC Granulocyte 6.1 2 - 6.9   Granulocyte percent 67.2 37 - 80 %G   RBC 4.30 4.04 - 5.48 M/uL   Hemoglobin 13.5 11 - 14.6 g/dL   HCT, POC 41.1 (A) 29 - 41 %   MCV 95.6 76 - 111 fL   MCH, POC 31.3 (A) 27 - 31.2 pg   MCHC 32.8 31.8 - 35.4 g/dL   RDW, POC 14.3 %   Platelet Count, POC 339 142 - 424 K/uL   MPV 7.9 0 - 99.8 fL   A total of 30 minutes was spent with the patient, greater than 50% of which was in counseling/coordination of care regarding differential diagnosis of abnormal vaginal bleeding, menopause, review of most recent blood work results including today's CBC, need for gynecological evaluation, review of most recent office visit notes,  morbid obesity issues and need for nutritional evaluation, prognosis and need for follow-up.   ASSESSMENT & PLAN: Michele Ayers was seen today for menstrual problem,  mass and medication refill.  Diagnoses and all orders for this visit:  Abnormal vaginal bleeding -     Hemoglobin A1c -     Comprehensive metabolic panel -     Ambulatory referral to Obstetrics / Gynecology -     POCT CBC -     TSH  Body mass index (BMI) of 60.0-69.9 in adult [facility]) -     Lipid panel -     Amb ref to Medical Nutrition Therapy-MNT  Lymphedema of both lower extremities -     silver sulfADIAZINE (SILVADENE) 1 % cream; Apply 1 application topically daily.  Sebaceous cyst  Perimenopause     Patient Instructions       If you have lab work done today you will be contacted with your lab results within the next 2 weeks.  If you have not heard from [place] then please contact us. The fastest way to get your results is to register for My Chart.   IF you received an x-ray today, you will receive an invoice from [facility]. Please contact [facility] at [phone] with questions or concerns regarding your invoice.   IF you received labwork today, you will receive an invoice from [facility]. Please contact [facility] at [phone] with questions or concerns regarding your invoice.   Our billing staff will not be able to assist you with questions regarding bills from these companies.  You will be contacted with the lab results as soon as they are available. The fastest way to get your results is to activate your My Chart account. Instructions are located on the last page of this paperwork. If you have not heard from [place] regarding the results in 2 weeks, please contact this office.      Calorie Counting for Weight Loss Calories are units of energy. Your body needs a certain amount of calories from food to keep you going throughout the day. When you eat more calories than your body needs, your body  stores the extra calories as fat. When you eat fewer calories than your body needs, your body burns fat to get the energy it needs. Calorie counting means keeping track of how many calories you eat and drink each day. Calorie counting can be helpful if you need to lose weight. If you make sure to eat fewer calories than your body needs, you should lose weight. Ask your health care provider what a healthy weight is for you. For calorie counting to work, you will need to eat the right number of calories in a day in order to lose a healthy amount of weight per week. A dietitian can help you determine how many calories you need in a day and will give you suggestions on how to reach your calorie goal.  A healthy amount of weight to lose per week is usually 1-2 lb (0.5-0.9 kg). This usually means that your daily calorie intake should be reduced by 500-750 calories.  Eating 1,200 - 1,500 calories per day can help most women lose weight.  Eating 1,500 - 1,800 calories per day can help most men lose weight. What is my plan? My goal is to have __________ calories per day. If I have this many calories per day, I should lose around __________ pounds per week. What do I need to know about calorie counting? In order to meet your daily calorie goal, you will need to:  Find out how many calories are in each food you would like to eat. Try to  do this before you eat.  Decide how much of the food you plan to eat.  Write down what you ate and how many calories it had. Doing this is called keeping a food log. To successfully lose weight, it is important to balance calorie counting with a healthy lifestyle that includes regular activity. Aim for 150 minutes of moderate exercise (such as walking) or 75 minutes of vigorous exercise (such as running) each week. Where do I find calorie information?  The number of calories in a food can be found on a Nutrition Facts label. If a food does not have a Nutrition Facts  label, try to look up the calories online or ask your dietitian for help. Remember that calories are listed per serving. If you choose to have more than one serving of a food, you will have to multiply the calories per serving by the amount of servings you plan to eat. For example, the label on a package of bread might say that a serving size is 1 slice and that there are 90 calories in a serving. If you eat 1 slice, you will have eaten 90 calories. If you eat 2 slices, you will have eaten 180 calories. How do I keep a food log? Immediately after each meal, record the following information in your food log:  What you ate. Don't forget to include toppings, sauces, and other extras on the food.  How much you ate. This can be measured in cups, ounces, or number of items.  How many calories each food and drink had.  The total number of calories in the meal. Keep your food log near you, such as in a small notebook in your pocket, or use a mobile app or website. Some programs will calculate calories for you and show you how many calories you have left for the day to meet your goal. What are some calorie counting tips?   Use your calories on foods and drinks that will fill you up and not leave you hungry: ? Some examples of foods that fill you up are nuts and nut butters, vegetables, lean proteins, and high-fiber foods like whole grains. High-fiber foods are foods with more than 5 g fiber per serving. ? Drinks such as sodas, specialty coffee drinks, alcohol, and juices have a lot of calories, yet do not fill you up.  Eat nutritious foods and avoid empty calories. Empty calories are calories you get from foods or beverages that do not have many vitamins or protein, such as candy, sweets, and soda. It is better to have a nutritious high-calorie food (such as an avocado) than a food with few nutrients (such as a bag of chips).  Know how many calories are in the foods you eat most often. This will help  you calculate calorie counts faster.  Pay attention to calories in drinks. Low-calorie drinks include water and unsweetened drinks.  Pay attention to nutrition labels for "low fat" or "fat free" foods. These foods sometimes have the same amount of calories or more calories than the full fat versions. They also often have added sugar, starch, or salt, to make up for flavor that was removed with the fat.  Find a way of tracking calories that works for you. Get creative. Try different apps or programs if writing down calories does not work for you. What are some portion control tips?  Know how many calories are in a serving. This will help you know how many servings of a certain  food you can have.  Use a measuring cup to measure serving sizes. You could also try weighing out portions on a kitchen scale. With time, you will be able to estimate serving sizes for some foods.  Take some time to put servings of different foods on your favorite plates, bowls, and cups so you know what a serving looks like.  Try not to eat straight from a bag or box. Doing this can lead to overeating. Put the amount you would like to eat in a cup or on a plate to make sure you are eating the right portion.  Use smaller plates, glasses, and bowls to prevent overeating.  Try not to multitask (for example, watch TV or use your computer) while eating. If it is time to eat, sit down at a table and enjoy your food. This will help you to know when you are full. It will also help you to be aware of what you are eating and how much you are eating. What are tips for following this plan? Reading food labels  Check the calorie count compared to the serving size. The serving size may be smaller than what you are used to eating.  Check the source of the calories. Make sure the food you are eating is high in vitamins and protein and low in saturated and trans fats. Shopping  Read nutrition labels while you shop. This will help  you make healthy decisions before you decide to purchase your food.  Make a grocery list and stick to it. Cooking  Try to cook your favorite foods in a healthier way. For example, try baking instead of frying.  Use low-fat dairy products. Meal planning  Use more fruits and vegetables. Half of your plate should be fruits and vegetables.  Include lean proteins like poultry and fish. How do I count calories when eating out?  Ask for smaller portion sizes.  Consider sharing an entree and sides instead of getting your own entree.  If you get your own entree, eat only half. Ask for a box at the beginning of your meal and put the rest of your entree in it so you are not tempted to eat it.  If calories are listed on the menu, choose the lower calorie options.  Choose dishes that include vegetables, fruits, whole grains, low-fat dairy products, and lean protein.  Choose items that are boiled, broiled, grilled, or steamed. Stay away from items that are buttered, battered, fried, or served with cream sauce. Items labeled "crispy" are usually fried, unless stated otherwise.  Choose water, low-fat milk, unsweetened iced tea, or other drinks without added sugar. If you want an alcoholic beverage, choose a lower calorie option such as a glass of wine or light beer.  Ask for dressings, sauces, and syrups on the side. These are usually high in calories, so you should limit the amount you eat.  If you want a salad, choose a garden salad and ask for grilled meats. Avoid extra toppings like bacon, cheese, or fried items. Ask for the dressing on the side, or ask for olive oil and vinegar or lemon to use as dressing.  Estimate how many servings of a food you are given. For example, a serving of cooked rice is  cup or about the size of half a baseball. Knowing serving sizes will help you be aware of how much food you are eating at restaurants. The list below tells you how big or small some common portion  sizes  are based on everyday objects: ? 1 oz--4 stacked dice. ? 3 oz--1 deck of cards. ? 1 tsp--1 die. ? 1 Tbsp-- a ping-pong ball. ? 2 Tbsp--1 ping-pong ball. ?  cup-- baseball. ? 1 cup--1 baseball. Summary  Calorie counting means keeping track of how many calories you eat and drink each day. If you eat fewer calories than your body needs, you should lose weight.  A healthy amount of weight to lose per week is usually 1-2 lb (0.5-0.9 kg). This usually means reducing your daily calorie intake by 500-750 calories.  The number of calories in a food can be found on a Nutrition Facts label. If a food does not have a Nutrition Facts label, try to look up the calories online or ask your dietitian for help.  Use your calories on foods and drinks that will fill you up, and not on foods and drinks that will leave you hungry.  Use smaller plates, glasses, and bowls to prevent overeating. This information is not intended to replace advice given to you by your health care provider. Make sure you discuss any questions you have with your health care provider. Document Revised: 09/08/2017 Document Reviewed: 11/20/2015 Elsevier Patient Education  [place].  Abnormal Uterine Bleeding Abnormal uterine bleeding means bleeding more than usual from your uterus. It can include:  Bleeding between periods.  Bleeding after sex.  Bleeding that is heavier than normal.  Periods that last longer than usual.  Bleeding after you have stopped having your period (menopause). There are many problems that may cause this. You should see a doctor for any kind of bleeding that is not normal. Treatment depends on the cause of the bleeding. Follow these instructions at home:  Watch your condition for any changes.  Do not use tampons, douche, or have sex, if your doctor tells you not to.  Change your pads often.  Get regular well-woman exams. Make sure they include a pelvic exam and cervical cancer  screening.  Keep all follow-up visits as told by your doctor. This is important. Contact a doctor if:  The bleeding lasts more than one week.  You feel dizzy at times.  You feel like you are going to throw up (nauseous).  You throw up. Get help right away if:  You pass out.  You have to change pads every hour.  You have belly (abdominal) pain.  You have a fever.  You get sweaty.  You get weak.  You passing large blood clots from your vagina. Summary  Abnormal uterine bleeding means bleeding more than usual from your uterus.  There are many problems that may cause this. You should see a doctor for any kind of bleeding that is not normal.  Treatment depends on the cause of the bleeding. This information is not intended to replace advice given to you by your health care provider. Make sure you discuss any questions you have with your health care provider. Document Revised: 12/15/2015 Document Reviewed: 12/15/2015 Elsevier Patient Education  2020 [employer]      Agustina Caroli, MD [facility]

## 2019-07-15 NOTE — Patient Instructions (Addendum)
If you have lab work done today you will be contacted with your lab results within the next 2 weeks.  If you have not heard from [place] then please contact us. The fastest way to get your results is to register for My Chart.   IF you received an x-ray today, you will receive an invoice from [facility]. Please contact [facility] at [phone] with questions or concerns regarding your invoice.   IF you received labwork today, you will receive an invoice from [facility]. Please contact [facility] at [phone] with questions or concerns regarding your invoice.   Our billing staff will not be able to assist you with questions regarding bills from these companies.  You will be contacted with the lab results as soon as they are available. The fastest way to get your results is to activate your My Chart account. Instructions are located on the last page of this paperwork. If you have not heard from [place] regarding the results in 2 weeks, please contact this office.      Calorie Counting for Weight Loss Calories are units of energy. Your body needs a certain amount of calories from food to keep you going throughout the day. When you eat more calories than your body needs, your body stores the extra calories as fat. When you eat fewer calories than your body needs, your body burns fat to get the energy it needs. Calorie counting means keeping track of how many calories you eat and drink each day. Calorie counting can be helpful if you need to lose weight. If you make sure to eat fewer calories than your body needs, you should lose weight. Ask your health care provider what a healthy weight is for you. For calorie counting to work, you will need to eat the right number of calories in a day in order to lose a healthy amount of weight per week. A dietitian can help you determine how many calories you need in a day and will give you suggestions on how to reach your calorie goal.  A healthy  amount of weight to lose per week is usually 1-2 lb (0.5-0.9 kg). This usually means that your daily calorie intake should be reduced by 500-750 calories.  Eating 1,200 - 1,500 calories per day can help most women lose weight.  Eating 1,500 - 1,800 calories per day can help most men lose weight. What is my plan? My goal is to have __________ calories per day. If I have this many calories per day, I should lose around __________ pounds per week. What do I need to know about calorie counting? In order to meet your daily calorie goal, you will need to:  Find out how many calories are in each food you would like to eat. Try to do this before you eat.  Decide how much of the food you plan to eat.  Write down what you ate and how many calories it had. Doing this is called keeping a food log. To successfully lose weight, it is important to balance calorie counting with a healthy lifestyle that includes regular activity. Aim for 150 minutes of moderate exercise (such as walking) or 75 minutes of vigorous exercise (such as running) each week. Where do I find calorie information?  The number of calories in a food can be found on a Nutrition Facts label. If a food does not have a Nutrition Facts label, try to look up the calories online or ask your dietitian  for help. Remember that calories are listed per serving. If you choose to have more than one serving of a food, you will have to multiply the calories per serving by the amount of servings you plan to eat. For example, the label on a package of bread might say that a serving size is 1 slice and that there are 90 calories in a serving. If you eat 1 slice, you will have eaten 90 calories. If you eat 2 slices, you will have eaten 180 calories. How do I keep a food log? Immediately after each meal, record the following information in your food log:  What you ate. Don't forget to include toppings, sauces, and other extras on the food.  How much you  ate. This can be measured in cups, ounces, or number of items.  How many calories each food and drink had.  The total number of calories in the meal. Keep your food log near you, such as in a small notebook in your pocket, or use a mobile app or website. Some programs will calculate calories for you and show you how many calories you have left for the day to meet your goal. What are some calorie counting tips?   Use your calories on foods and drinks that will fill you up and not leave you hungry: ? Some examples of foods that fill you up are nuts and nut butters, vegetables, lean proteins, and high-fiber foods like whole grains. High-fiber foods are foods with more than 5 g fiber per serving. ? Drinks such as sodas, specialty coffee drinks, alcohol, and juices have a lot of calories, yet do not fill you up.  Eat nutritious foods and avoid empty calories. Empty calories are calories you get from foods or beverages that do not have many vitamins or protein, such as candy, sweets, and soda. It is better to have a nutritious high-calorie food (such as an avocado) than a food with few nutrients (such as a bag of chips).  Know how many calories are in the foods you eat most often. This will help you calculate calorie counts faster.  Pay attention to calories in drinks. Low-calorie drinks include water and unsweetened drinks.  Pay attention to nutrition labels for "low fat" or "fat free" foods. These foods sometimes have the same amount of calories or more calories than the full fat versions. They also often have added sugar, starch, or salt, to make up for flavor that was removed with the fat.  Find a way of tracking calories that works for you. Get creative. Try different apps or programs if writing down calories does not work for you. What are some portion control tips?  Know how many calories are in a serving. This will help you know how many servings of a certain food you can have.  Use a  measuring cup to measure serving sizes. You could also try weighing out portions on a kitchen scale. With time, you will be able to estimate serving sizes for some foods.  Take some time to put servings of different foods on your favorite plates, bowls, and cups so you know what a serving looks like.  Try not to eat straight from a bag or box. Doing this can lead to overeating. Put the amount you would like to eat in a cup or on a plate to make sure you are eating the right portion.  Use smaller plates, glasses, and bowls to prevent overeating.  Try not to multitask (for  example, watch TV or use your computer) while eating. If it is time to eat, sit down at a table and enjoy your food. This will help you to know when you are full. It will also help you to be aware of what you are eating and how much you are eating. What are tips for following this plan? Reading food labels  Check the calorie count compared to the serving size. The serving size may be smaller than what you are used to eating.  Check the source of the calories. Make sure the food you are eating is high in vitamins and protein and low in saturated and trans fats. Shopping  Read nutrition labels while you shop. This will help you make healthy decisions before you decide to purchase your food.  Make a grocery list and stick to it. Cooking  Try to cook your favorite foods in a healthier way. For example, try baking instead of frying.  Use low-fat dairy products. Meal planning  Use more fruits and vegetables. Half of your plate should be fruits and vegetables.  Include lean proteins like poultry and fish. How do I count calories when eating out?  Ask for smaller portion sizes.  Consider sharing an entree and sides instead of getting your own entree.  If you get your own entree, eat only half. Ask for a box at the beginning of your meal and put the rest of your entree in it so you are not tempted to eat it.  If calories  are listed on the menu, choose the lower calorie options.  Choose dishes that include vegetables, fruits, whole grains, low-fat dairy products, and lean protein.  Choose items that are boiled, broiled, grilled, or steamed. Stay away from items that are buttered, battered, fried, or served with cream sauce. Items labeled "crispy" are usually fried, unless stated otherwise.  Choose water, low-fat milk, unsweetened iced tea, or other drinks without added sugar. If you want an alcoholic beverage, choose a lower calorie option such as a glass of wine or light beer.  Ask for dressings, sauces, and syrups on the side. These are usually high in calories, so you should limit the amount you eat.  If you want a salad, choose a garden salad and ask for grilled meats. Avoid extra toppings like bacon, cheese, or fried items. Ask for the dressing on the side, or ask for olive oil and vinegar or lemon to use as dressing.  Estimate how many servings of a food you are given. For example, a serving of cooked rice is  cup or about the size of half a baseball. Knowing serving sizes will help you be aware of how much food you are eating at restaurants. The list below tells you how big or small some common portion sizes are based on everyday objects: ? 1 oz--4 stacked dice. ? 3 oz--1 deck of cards. ? 1 tsp--1 die. ? 1 Tbsp-- a ping-pong ball. ? 2 Tbsp--1 ping-pong ball. ?  cup-- baseball. ? 1 cup--1 baseball. Summary  Calorie counting means keeping track of how many calories you eat and drink each day. If you eat fewer calories than your body needs, you should lose weight.  A healthy amount of weight to lose per week is usually 1-2 lb (0.5-0.9 kg). This usually means reducing your daily calorie intake by 500-750 calories.  The number of calories in a food can be found on a Nutrition Facts label. If a food does not have a Nutrition  Facts label, try to look up the calories online or ask your dietitian for  help.  Use your calories on foods and drinks that will fill you up, and not on foods and drinks that will leave you hungry.  Use smaller plates, glasses, and bowls to prevent overeating. This information is not intended to replace advice given to you by your health care provider. Make sure you discuss any questions you have with your health care provider. Document Revised: 09/08/2017 Document Reviewed: 11/20/2015 Elsevier Patient Education  [place].  Abnormal Uterine Bleeding Abnormal uterine bleeding means bleeding more than usual from your uterus. It can include:  Bleeding between periods.  Bleeding after sex.  Bleeding that is heavier than normal.  Periods that last longer than usual.  Bleeding after you have stopped having your period (menopause). There are many problems that may cause this. You should see a doctor for any kind of bleeding that is not normal. Treatment depends on the cause of the bleeding. Follow these instructions at home:  Watch your condition for any changes.  Do not use tampons, douche, or have sex, if your doctor tells you not to.  Change your pads often.  Get regular well-woman exams. Make sure they include a pelvic exam and cervical cancer screening.  Keep all follow-up visits as told by your doctor. This is important. Contact a doctor if:  The bleeding lasts more than one week.  You feel dizzy at times.  You feel like you are going to throw up (nauseous).  You throw up. Get help right away if:  You pass out.  You have to change pads every hour.  You have belly (abdominal) pain.  You have a fever.  You get sweaty.  You get weak.  You passing large blood clots from your vagina. Summary  Abnormal uterine bleeding means bleeding more than usual from your uterus.  There are many problems that may cause this. You should see a doctor for any kind of bleeding that is not normal.  Treatment depends on the cause of the  bleeding. This information is not intended to replace advice given to you by your health care provider. Make sure you discuss any questions you have with your health care provider. Document Revised: 12/15/2015 Document Reviewed: 12/15/2015 Elsevier Patient Education  2020 [employer].

## 2019-07-16 LAB — COMPREHENSIVE METABOLIC PANEL
ALT: 11 IU/L (ref 0–32)
AST: 11 IU/L (ref 0–40)
Albumin/Globulin Ratio: 1.4 (ref 1.2–2.2)
Albumin: 3.9 g/dL (ref 3.8–4.9)
Alkaline Phosphatase: 84 IU/L (ref 48–121)
BUN/Creatinine Ratio: 14 (ref 9–23)
BUN: 9 mg/dL (ref 6–24)
Bilirubin Total: 0.4 mg/dL (ref 0.0–1.2)
CO2: 24 mmol/L (ref 20–29)
Calcium: 8.6 mg/dL — ABNORMAL LOW (ref 8.7–10.2)
Chloride: 100 mmol/L (ref 96–106)
Creatinine, Ser: 0.64 mg/dL (ref 0.57–1.00)
GFR calc Af Amer: 119 mL/min/{1.73_m2} (ref >59)
GFR calc non Af Amer: 103 mL/min/{1.73_m2} (ref >59)
Globulin, Total: 2.8 g/dL (ref 1.5–4.5)
Glucose: 99 mg/dL (ref 65–99)
Potassium: 4.2 mmol/L (ref 3.5–5.2)
Sodium: 137 mmol/L (ref 134–144)
Total Protein: 6.7 g/dL (ref 6.0–8.5)

## 2019-07-16 LAB — HEMOGLOBIN A1C
Est. average glucose Bld gHb Est-mCnc: 128 mg/dL
Hgb A1c MFr Bld: 6.1 % — ABNORMAL HIGH (ref 4.8–5.6)

## 2019-07-16 LAB — TSH: TSH: 3 u[IU]/mL (ref 0.450–4.500)

## 2019-07-18 ENCOUNTER — Ambulatory Visit (INDEPENDENT_AMBULATORY_CARE_PROVIDER_SITE_OTHER): Payer: Worker's Compensation | Admitting: Physician Assistant

## 2019-07-18 ENCOUNTER — Encounter: Payer: No Typology Code available for payment source | Attending: Emergency Medicine | Admitting: Registered"

## 2019-07-18 ENCOUNTER — Encounter: Payer: Self-pay | Admitting: Orthopedic Surgery

## 2019-07-18 ENCOUNTER — Other Ambulatory Visit: Payer: Self-pay

## 2019-07-18 ENCOUNTER — Encounter: Payer: Self-pay | Admitting: Registered"

## 2019-07-18 VITALS — Ht 64.0 in | Wt 358.0 lb

## 2019-07-18 DIAGNOSIS — M79672 Pain in left foot: Secondary | ICD-10-CM | POA: Diagnosis not present

## 2019-07-18 DIAGNOSIS — E669 Obesity, unspecified: Secondary | ICD-10-CM | POA: Insufficient documentation

## 2019-07-18 NOTE — Progress Notes (Signed)
  Medical Nutrition Therapy:  Appt start time: 3:35 end time:  4:15.   Assessment:  Primary concerns today: Per recent lab values elevated A1c 6.1 and decreased HDL (36).   Pt expectations: wants to lose weight and wants to know how she can control lymphedema  Pt states she doesn't eat much. States she eats about once a day. Eats everything baked or boiled. States she has fluid and lymphedema.   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  States sometimes she works 24 hrs/day. Reports she was working 3 jobs at a time but now currently working 1 job.  No longer working weekends. Reports work schedule includes daytime hours and some overnight hours. States she works 60 hrs/week and will pick up shifts between these times if she wants to.   Reports her sleep schedule varies depending on work schedule. 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   Pt states she has membership at [employer] and access to a pool at [employer]. States she was recently released after having a fall and injuring her foot. Able to be involved in movement.    Preferred Learning Style:   No preference indicated   Learning Readiness:   Ready  Change in progress   MEDICATIONS: See list   DIETARY INTAKE:  Usual eating pattern includes 2 meals and 0 snacks per day.  Everyday foods include chicken salad, rice, vegetables, and chicken.  Avoided foods include carbs (bread), fried food, sweets, junk food.    24-hr recall:  B ( AM): typically skips  Snk ( AM):   L (11 AM): chicken salad + crackers Snk ( PM):  D (5:30 PM): [place] food - chicken, rice, vegetables Snk ( PM):  Beverages: water (8*16 oz; 128 oz)  Usual physical activity: none reported  Estimated energy needs: 1600 calories 180 g carbohydrates 120 g protein 44 g fat  Progress Towards Goal(s):  In progress.   Nutritional Diagnosis:  NB-1.1 Food and nutrition-related  knowledge deficit As related to prediabetes.  As evidenced by pt verbalizes incomplete information.    Intervention: Pt was educated and counseled on metabolism, importance of not skipping meals, effects of dieting/restriction, how carbohydrates work in the body, A1c ranges for prediabetes/diabetes, role of fiber in eating regimen, and importance of physical activity. Discussed meal/snack planning and how to balance meals/snacks using MyPlate for prediabetes. Pt was in agreement with goals listed. - Aim to eat every 3 -5 hours. - Aim to have breakfast within 1-2 hours of waking up.  - Aim to have 1/2 plate non-starchy vegetables + 1/4 plate starch/grain + 1/4 plate protein.  - Aim to increase movement to 15-20 min, 2 days/week.   Teaching Method Utilized:  Visual Auditory Hands on  Handouts given during visit include:  MyPlate for prediabetes  Barriers to learning/adherence to lifestyle change: work-life balance  Demonstrated degree of understanding via:  Teach Back   Monitoring/Evaluation:  Dietary intake, exercise, and body weight prn.

## 2019-07-18 NOTE — Progress Notes (Signed)
Office Visit Note   Patient: Michele Ayers           Date of Birth: Aug 06, 1965           MRN: [id number] Visit Date: 07/18/2019              Requested by: Horald Pollen, MD [place],  [place] [address] PCP: Horald Pollen, MD  Chief Complaint  Patient presents with  . Left Foot - Follow-up      HPI: This is a pleasant woman who is 2 months status post left fifth base metatarsal fracture.  She is feeling good and would like to be returned to full duties at work.  Assessment & Plan: Visit Diagnoses: No diagnosis found.  Plan: Patient will be released to full duties as of next Monday follow-up as needed  Follow-Up Instructions: No follow-ups on file.   Ortho Exam  Patient is alert, oriented, no adenopathy, well-dressed, normal affect, normal respiratory effort. Focused examination of her left foot demonstrates no cellulitis she does have some soft tissue swelling but discussed throughout both of her legs with her history of lymphedema.  She has good strength and no pain to resisted eversion nontender over the base of the fifth metatarsal  Imaging: No results found. No images are attached to the encounter.  Labs: Lab Results  Component Value Date   HGBA1C 6.1 (H) 07/15/2019   HGBA1C 5.8 (H) 01/03/2019   REPTSTATUS 05/22/2008 FINAL 05/20/2008   CULT NO GROWTH 05/20/2008     Lab Results  Component Value Date   ALBUMIN 3.9 07/15/2019   ALBUMIN 4.0 01/03/2019   ALBUMIN 3.9 06/11/2015    No results found for: MG No results found for: VD25OH  No results found for: PREALBUMIN CBC EXTENDED Latest Ref Rng & Units 07/15/2019 01/03/2019 06/11/2015  WBC 4.6 - 10.2 K/uL 9.1 9.4 10.7(H)  RBC 4.04 - 5.48 M/uL 4.30 4.55 4.07  HGB 11 - 14.6 g/dL 13.5 14.3 13.2  HCT 29 - 41 % 41.1(A) 42.4 39.7  PLT 150 - 450 x10E3/uL - 365 337  NEUTROABS 1 - 7 x10E3/uL - 5.3 6.4  LYMPHSABS 0 - 3 x10E3/uL - 3.0 3.4     Body mass index is 61.45 kg/m.  Orders:    No orders of the defined types were placed in this encounter.  No orders of the defined types were placed in this encounter.    Procedures: No procedures performed  Clinical Data: No additional findings.  ROS:  All other systems negative, except as noted in the HPI. Review of Systems  Objective: Vital Signs: Ht 5\' 4"  (1.626 m)   Wt (!) 358 lb (162.4 kg)   BMI 61.45 kg/m   Specialty Comments:  No specialty comments available.  PMFS History: Patient Active Problem List   Diagnosis Date Noted  . Body mass index (BMI) of 60.0-69.9 in adult ([place]) 07/15/2019  . Abnormal vaginal bleeding 07/15/2019  . Perimenopause 07/15/2019  . Lymphedema of both lower extremities 01/03/2019  . Morbid obesity with BMI of 50.0-59.9, adult ([place]) 01/03/2019   No past medical history on file.  No family history on file.  Past Surgical History:  Procedure Laterality Date  . TUBAL LIGATION     Social History   Occupational History  . Not on file  Tobacco Use  . Smoking status: Former Smoker    Types: Cigarettes  . Smokeless tobacco: Never Used  . Tobacco comment: per pt stopped a month ago only  smoked a pack in  3 days  Substance and Sexual Activity  . Alcohol use: No  . Drug use: No  . Sexual activity: Not on file

## 2019-07-18 NOTE — Patient Instructions (Signed)
-   Aim to eat every 3 -5 hours.  - Aim to have breakfast within 1-2 hours of waking up.   - Aim to have 1/2 plate non-starchy vegetables + 1/4 plate starch/grain + 1/4 plate protein.   - Aim to increase movement to 15-20 min, 2 days/week.

## 2019-08-15 ENCOUNTER — Other Ambulatory Visit: Payer: Self-pay

## 2019-08-15 ENCOUNTER — Ambulatory Visit (INDEPENDENT_AMBULATORY_CARE_PROVIDER_SITE_OTHER): Payer: No Typology Code available for payment source | Admitting: Family Medicine

## 2019-08-15 ENCOUNTER — Encounter: Payer: Self-pay | Admitting: Family Medicine

## 2019-08-15 VITALS — BP 122/75 | HR 84 | Temp 97.6°F | Ht 63.0 in | Wt 358.4 lb

## 2019-08-15 DIAGNOSIS — E65 Localized adiposity: Secondary | ICD-10-CM

## 2019-08-15 DIAGNOSIS — R8290 Unspecified abnormal findings in urine: Secondary | ICD-10-CM

## 2019-08-15 DIAGNOSIS — M461 Sacroiliitis, not elsewhere classified: Secondary | ICD-10-CM | POA: Diagnosis not present

## 2019-08-15 DIAGNOSIS — M545 Low back pain: Secondary | ICD-10-CM | POA: Diagnosis not present

## 2019-08-15 DIAGNOSIS — G8929 Other chronic pain: Secondary | ICD-10-CM | POA: Diagnosis not present

## 2019-08-15 LAB — POCT URINALYSIS DIP (MANUAL ENTRY)
Bilirubin, UA: NEGATIVE
Blood, UA: NEGATIVE
Glucose, UA: NEGATIVE mg/dL
Ketones, POC UA: NEGATIVE mg/dL
Leukocytes, UA: NEGATIVE
Nitrite, UA: NEGATIVE
Protein Ur, POC: NEGATIVE mg/dL
Spec Grav, UA: >=1.03 — AB (ref 1.010–1.025)
Urobilinogen, UA: 0.2 E.U./dL
pH, UA: 6.5 (ref 5.0–8.0)

## 2019-08-15 MED ORDER — DICLOFENAC SODIUM 75 MG PO TBEC: 75.0000 mg | DELAYED_RELEASE_TABLET | Freq: Two times a day (BID) | ORAL | 1 refills | Status: DC

## 2019-08-15 NOTE — Progress Notes (Signed)
Patient ID: Michele Ayers, female    DOB: [birth year]  Age: 54 y.o. MRN: [id number]  Chief Complaint  Patient presents with  . Possibe UTI    Pt stated that she thinks she has a UTI bc she has been running back/forth to the restroom, back pain, and a strong urine smell for the past couple of days. Her urine has gotten darker and it is cloudy with a foul oder.    Subjective:     Current allergies, medications, problem list, past/family and social histories reviewed.  Objective:  BP 122/75 (BP Location: Left Arm, Patient Position: Sitting, Cuff Size: Large)   Pulse 84   Temp 97.6 F (36.4 C) (Temporal)   Ht 5\' 3"  (1.6 m)   Wt (!) 358 lb 6.4 oz (162.6 kg)   SpO2 94%   BMI 63.49 kg/m     Assessment & Plan:   Assessment: 1. Chronic bilateral low back pain, unspecified whether sciatica present   2. Sacroiliitis (HCC)   3. Fat pad   4. Bad odor of urine       Plan:   Orders Placed This Encounter  Procedures  . Urine Culture  . POCT urinalysis dipstick    Meds ordered this encounter  Medications  . diclofenac (VOLTAREN) 75 MG EC tablet    Sig: Take 1 tablet (75 mg total) by mouth 2 (two) times daily.    Dispense:  40 tablet    Refill:  1         Patient Instructions    Continue to drink lots of fluids.  Urine culture is pending  Take diclofenac 75 mg 1 twice daily with food for pain and inflammation of sacroiliac joint.  Continue for about 3 weeks and see how it does.  If pain continues to persist in that left area we can refer you to an orthopedic back specialist to see whether they would consider injecting the left SI joint region.  Do not take any ibuprofen or Aleve when you are on the diclofenac.  Return as needed   If you have lab work done today you will be contacted with your lab results within the next 2 weeks.  If you have not heard from [place] then please contact us. The fastest way to get your results is to register for My Chart.   IF you  received an x-ray today, you will receive an invoice from [facility]. Please contact [facility] at [phone] with questions or concerns regarding your invoice.   IF you received labwork today, you will receive an invoice from [place]. Please contact [facility] at [phone] with questions or concerns regarding your invoice.   Our billing staff will not be able to assist you with questions regarding bills from these companies.  You will be contacted with the lab results as soon as they are available. The fastest way to get your results is to activate your My Chart account. Instructions are located on the last page of this paperwork. If you have not heard from [place] regarding the results in 2 weeks, please contact this office.        Return if symptoms worsen or fail to improve.   Ruben Reason, MD 8/12/2021Patient ID: Michele Ayers, female    DOB: [birth year]  Age: 54 y.o. MRN: [id number]  Chief Complaint  Patient presents with  . Possibe UTI    Pt stated that she thinks she has a UTI bc she has been running back/forth to the restroom, back  pain, and a strong urine smell for the past couple of days. Her urine has gotten darker and it is cloudy with a foul oder.    Subjective:  Patient is here with 2 main concerns.  She has been having more problems with pain and swelling in the lipoma of her left low back.  At least she was told it was a lipoma.  It has been going on for some time now.  Her second problem is her urine has been dark and had an odor to it the last few days.  No dysuria.  She has been eating a lot of beets and carrots and vegetables.  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  She has lymphedema problems and has a difficult time with her weight and cannot get a lot of exercise.  Current allergies, medications, problem list, past/family and social histories reviewed.  Objective:  BP 122/75 (BP Location: Left Arm,  Patient Position: Sitting, Cuff Size: Large)   Pulse 84   Temp 97.6 F (36.4 C) (Temporal)   Ht 5\' 3"  (1.6 m)   Wt (!) 358 lb 6.4 oz (162.6 kg)   SpO2 94%   BMI 63.49 kg/m   Pleasant young lady, obese, no acute distress.  No CVA tenderness.  Is tender at the left SI joint area.  The little fat pad over the SI joint is more prominent and tender on the left side than on the right.  Assessment & Plan:   Assessment: 1. Chronic bilateral low back pain, unspecified whether sciatica present   2. Sacroiliitis (HCC)   3. Fat pad   4. Bad odor of urine      Patient ID: Michele Ayers, female    DOB: 11-04-1965  Age: 54 y.o. MRN: [id number]  Chief Complaint  Patient presents with  . Possibe UTI    Pt stated that she thinks she has a UTI bc she has been running back/forth to the restroom, back pain, and a strong urine smell for the past couple of days. Her urine has gotten darker and it is cloudy with a foul oder.    Subjective:     Current allergies, medications, problem list, past/family and social histories reviewed.  Objective:  BP 122/75 (BP Location: Left Arm, Patient Position: Sitting, Cuff Size: Large)   Pulse 84   Temp 97.6 F (36.4 C) (Temporal)   Ht 5\' 3"  (1.6 m)   Wt (!) 358 lb 6.4 oz (162.6 kg)   SpO2 94%   BMI 63.49 kg/m   No major acute distress.  The fat pad over the left SI joint area is larger and more tender than the one on the right.  No defined lipoma in my opinion.  Assessment & Plan:   Assessment: 1. Chronic bilateral low back pain, unspecified whether sciatica present   2. Sacroiliitis (HCC)   3. Fat pad   4. Bad odor of urine       Plan: See instructions.  If she keeps having problems we will refer her to an orthopedic back person to assess that area. Orders Placed This Encounter  Procedures  . Urine Culture  . POCT urinalysis dipstick    Meds ordered this encounter  Medications  . diclofenac (VOLTAREN) 75 MG EC tablet    Sig: Take 1  tablet (75 mg total) by mouth 2 (two) times daily.    Dispense:  40 tablet    Refill:  1         Patient Instructions    Continue to drink lots of fluids.  Urine culture is pending  Take diclofenac 75 mg 1 twice daily with food for pain and inflammation of sacroiliac joint.  Continue for about 3 weeks and see how it does.  If pain continues to persist in that left area we can refer you to an orthopedic back specialist to see whether they would consider injecting the left SI joint region.  Do not take any ibuprofen or Aleve when you are on the diclofenac.  Return as needed   If you have lab work done today you will be contacted with your lab results within the next 2 weeks.  If you have not heard from [place] then please contact us. The fastest way to get your results is to register for My Chart.   IF you received an x-ray today, you will receive an invoice from [facility]. Please contact [facility]iology at [phone] with questions or concerns regarding your invoice.   IF you received labwork today, you will receive an invoice from [place]. Please contact [facility] at [phone] with questions or concerns regarding your invoice.   Our billing staff will not be able to assist you with questions regarding bills from these companies.  You will be contacted with the lab results as soon as they are available. The fastest way to get your results is to activate your My Chart account. Instructions are located on the last page of this paperwork. If you have not heard from [place] regarding the results in 2 weeks, please contact this office.        Return if symptoms worsen or fail to improve.   Ruben Reason, MD 08/15/2019 Plan:   Orders Placed This Encounter  Procedures  . Urine Culture  . POCT urinalysis dipstick    Meds ordered this encounter  Medications  . diclofenac (VOLTAREN) 75 MG EC tablet    Sig: Take 1 tablet (75 mg total) by mouth 2 (two) times  daily.    Dispense:  40 tablet    Refill:  1         Patient Instructions    Continue to drink lots of fluids.  Urine culture is pending  Take diclofenac 75 mg 1 twice daily with food for pain and inflammation of sacroiliac joint.  Continue for about 3 weeks and see how it does.  If pain continues to persist in that left area we can refer you to an orthopedic back specialist to see whether they would consider injecting the left SI joint region.  Do not take any ibuprofen or Aleve when you are on the diclofenac.  Return as needed   If you have lab work done today you will be contacted with your lab results within the next 2 weeks.  If you have not heard from [place] then please contact us. The fastest way to get your results is to register for My Chart.   IF you received an x-ray today, you will receive an invoice from [facility]. Please contact [facility]iology at [phone] with questions or concerns regarding your invoice.   IF you received labwork today, you will receive an invoice from [place]. Please contact [facility] at [phone] with questions or concerns regarding your invoice.   Our billing staff will not be able to assist you with questions regarding bills from these companies.  You will be contacted with the lab results as soon as they  are available. The fastest way to get your results is to activate your My Chart account. Instructions are located on the last page of this paperwork. If you have not heard from [place] regarding the results in 2 weeks, please contact this office.        Return if symptoms worsen or fail to improve.   Ruben Reason, MD 8/12/2021poct

## 2019-08-15 NOTE — Patient Instructions (Addendum)
  Continue to drink lots of fluids.  Urine culture is pending  Take diclofenac 75 mg 1 twice daily with food for pain and inflammation of sacroiliac joint.  Continue for about 3 weeks and see how it does.  If pain continues to persist in that left area we can refer you to an orthopedic back specialist to see whether they would consider injecting the left SI joint region.  Do not take any ibuprofen or Aleve when you are on the diclofenac.  Return as needed   If you have lab work done today you will be contacted with your lab results within the next 2 weeks.  If you have not heard from [place] then please contact us. The fastest way to get your results is to register for My Chart.   IF you received an x-ray today, you will receive an invoice from [facility]. Please contact [facility] Radiology at [phone] with questions or concerns regarding your invoice.   IF you received labwork today, you will receive an invoice from [place]. Please contact [facility] at [phone] with questions or concerns regarding your invoice.   Our billing staff will not be able to assist you with questions regarding bills from these companies.  You will be contacted with the lab results as soon as they are available. The fastest way to get your results is to activate your My Chart account. Instructions are located on the last page of this paperwork. If you have not heard from [place] regarding the results in 2 weeks, please contact this office.

## 2019-08-17 LAB — URINE CULTURE

## 2019-09-04 ENCOUNTER — Other Ambulatory Visit: Payer: Self-pay

## 2019-09-04 ENCOUNTER — Other Ambulatory Visit (HOSPITAL_COMMUNITY)
Admission: RE | Admit: 2019-09-04 | Discharge: 2019-09-04 | Disposition: A | Discharge status: Home / Self Care | Payer: No Typology Code available for payment source | Source: Ambulatory Visit | Attending: Obstetrics & Gynecology | Admitting: Obstetrics & Gynecology

## 2019-09-04 ENCOUNTER — Encounter: Payer: Self-pay | Admitting: Obstetrics & Gynecology

## 2019-09-04 ENCOUNTER — Ambulatory Visit (INDEPENDENT_AMBULATORY_CARE_PROVIDER_SITE_OTHER): Payer: No Typology Code available for payment source | Admitting: Obstetrics & Gynecology

## 2019-09-04 VITALS — BP 125/90 | HR 88 | Ht 66.0 in | Wt 353.8 lb

## 2019-09-04 DIAGNOSIS — Z1239 Encounter for other screening for malignant neoplasm of breast: Secondary | ICD-10-CM

## 2019-09-04 DIAGNOSIS — F329 Major depressive disorder, single episode, unspecified: Secondary | ICD-10-CM | POA: Diagnosis not present

## 2019-09-04 DIAGNOSIS — N938 Other specified abnormal uterine and vaginal bleeding: Secondary | ICD-10-CM | POA: Diagnosis not present

## 2019-09-04 DIAGNOSIS — N951 Menopausal and female climacteric states: Secondary | ICD-10-CM

## 2019-09-04 DIAGNOSIS — F32A Depression, unspecified: Secondary | ICD-10-CM

## 2019-09-04 DIAGNOSIS — Z6841 Body Mass Index (BMI) 40.0 and over, adult: Secondary | ICD-10-CM

## 2019-09-04 DIAGNOSIS — Z01419 Encounter for gynecological examination (general) (routine) without abnormal findings: Secondary | ICD-10-CM

## 2019-09-04 MED ORDER — MEDROXYPROGESTERONE ACETATE 10 MG PO TABS: 20.0000 mg | ORAL_TABLET | Freq: Every day | ORAL | 2 refills | Status: DC

## 2019-09-04 NOTE — Progress Notes (Signed)
Patient ID: Michele Ayers, female   DOB: [birth year], 54 y.o.   MRN: [id number]  Chief Complaint  Patient presents with  . Gynecologic Exam  irregular vaginal bleeding  Michele Ayers is a 54 y.o. female.  Michele Ayers No LMP recorded. (Menstrual status: Irregular Periods). She has had resumption of vaginal bleeding after no menses for a year. Bleeding now almost daily for 2 months, only spotting today.   Michele  Past Medical History:  Diagnosis Date  . Lipoma of back   . Lymph edema     Past Surgical History:  Procedure Laterality Date  . TUBAL LIGATION      Family History  Problem Relation Age of Onset  . COPD Other   . Cancer Other     Social History Social History   Tobacco Use  . Smoking status: Former Smoker    Types: Cigarettes  . Smokeless tobacco: Never Used  . Tobacco comment: per pt stopped a month ago only smoked a pack in  3 days  Substance Use Topics  . Alcohol use: No  . Drug use: No    No Known Allergies  Current Outpatient Medications  Medication Sig Dispense Refill  . aspirin EC 81 MG tablet Take 81 mg by mouth daily. Swallow whole.    . diclofenac (VOLTAREN) 75 MG EC tablet Take 1 tablet (75 mg total) by mouth 2 (two) times daily. 40 tablet 1  . furosemide (LASIX) 40 MG tablet Take 40 mg by mouth 1 day or 1 dose.    . silver sulfADIAZINE (SILVADENE) 1 % cream Apply 1 application topically daily. 50 g 2  . furosemide (LASIX) 20 MG tablet Take 2 tablets (40 mg total) by mouth daily. 180 tablet 3  . medroxyPROGESTERone (PROVERA) 10 MG tablet Take 2 tablets (20 mg total) by mouth daily. 60 tablet 2   No current facility-administered medications for this visit.    Review of Systems Review of Systems  Constitutional: Negative.   Respiratory: Negative.   Gastrointestinal: Negative.   Genitourinary: Positive for menstrual problem and vaginal bleeding. Negative for pelvic pain.  Musculoskeletal:       Edema  Skin:       Venous insufficiency  changes lower legs    Blood pressure 125/90, pulse 88, height 5\' 6"  (1.676 m), weight (!) 353 lb 12.8 oz (160.5 kg).  Physical Exam Physical Exam Constitutional:      Appearance: She is obese. She is not ill-appearing.  Pulmonary:     Effort: Pulmonary effort is normal.  Abdominal:     Comments: obese  Genitourinary:    General: Normal vulva.     Comments: Pelvic exam: normal external genitalia, vulva, vagina, cervix, uterus and adnexa.  Musculoskeletal:        General: Swelling present.  Skin:    General: Skin is warm and dry.  Neurological:     Mental Status: She is alert.  Psychiatric:        Mood and Affect: Mood normal.        Behavior: Behavior normal.     Data Reviewed CBC    Component Value Date/Time   WBC 9.1 07/15/2019 0943   WBC 10.7 (H) 06/11/2015 1700   RBC 4.30 07/15/2019 0943   RBC 4.07 06/11/2015 1700   HGB 13.5 07/15/2019 0943   HGB 14.3 01/03/2019 1048   HCT 41.1 (A) 07/15/2019 0943   HCT 42.4 01/03/2019 1048   PLT 365 01/03/2019 1048   MCV 95.6 07/15/2019 0943  MCV 93 01/03/2019 1048   MCH 31.3 (A) 07/15/2019 0943   MCH 32.4 06/11/2015 1700   MCHC 32.8 07/15/2019 0943   MCHC 33.2 06/11/2015 1700   RDW 12.6 01/03/2019 1048   LYMPHSABS 3.0 01/03/2019 1048   MONOABS 0.9 06/11/2015 1700   EOSABS 0.2 01/03/2019 1048   BASOSABS 0.0 01/03/2019 1048     Assessment Encounter for gynecological examination - Plan: Cytology - PAP( [place]), CBC, FSH, US PELVIC COMPLETE WITH TRANSVAGINAL, medroxyPROGESTERone (PROVERA) 10 MG tablet  Breast screening - Plan: MM Digital Screening  Depression, unspecified depression type - Plan: Ambulatory referral to [place]  DUB (dysfunctional uterine bleeding) - Plan: CBC, FSH, US PELVIC COMPLETE WITH TRANSVAGINAL, medroxyPROGESTERone (PROVERA) 10 MG tablet  Body mass index (BMI) of 60.0-69.9 in adult (Michele Ayers)  Perimenopause - Plan: CBC, FSH, US PELVIC COMPLETE WITH TRANSVAGINAL,  medroxyPROGESTERone (PROVERA) 10 MG tablet    Plan Orders Placed This Encounter  Procedures  . MM Digital Screening  . US PELVIC COMPLETE WITH TRANSVAGINAL  . CBC  . [place]  . Ambulatory referral to [place]   F/u for results     Michele Ayers 09/04/2019, 10:24 AM

## 2019-09-04 NOTE — Patient Instructions (Signed)

## 2019-09-04 NOTE — Progress Notes (Signed)
Pt scored 21 on PHQ-9, #2 FOR QUESTION 9. Referred Pt to [place].

## 2019-09-05 LAB — CBC
Hematocrit: 42.8 % (ref 34.0–46.6)
Hemoglobin: 14.2 g/dL (ref 11.1–15.9)
MCH: 31.3 pg (ref 26.6–33.0)
MCHC: 33.2 g/dL (ref 31.5–35.7)
MCV: 95 fL (ref 79–97)
Platelets: 375 10*3/uL (ref 150–450)
RBC: 4.53 x10E6/uL (ref 3.77–5.28)
RDW: 12.3 % (ref 11.7–15.4)
WBC: 9 10*3/uL (ref 3.4–10.8)

## 2019-09-05 LAB — CYTOLOGY - PAP
Adequacy: ABSENT
Comment: NEGATIVE
Diagnosis: NEGATIVE
High risk HPV: NEGATIVE

## 2019-09-05 LAB — FOLLICLE STIMULATING HORMONE: FSH: 10.4 m[IU]/mL

## 2019-09-10 ENCOUNTER — Telehealth: Payer: Self-pay | Admitting: Emergency Medicine

## 2019-09-10 DIAGNOSIS — M461 Sacroiliitis, not elsewhere classified: Secondary | ICD-10-CM

## 2019-09-10 DIAGNOSIS — I890 Lymphedema, not elsewhere classified: Secondary | ICD-10-CM

## 2019-09-10 NOTE — Telephone Encounter (Signed)
This medication does not come from [place].

## 2019-09-10 NOTE — Telephone Encounter (Signed)
Pt has recently changed pharmacy , because [facility] does not take her insurance   Patient  needs all her refills changed [facility] [address] [facility]   medroxyPROGESTERone (PROVERA) 10 MG tablet [[id number]   silver sulfADIAZINE (SILVADENE) 1 % cream [[id number] Dose: 40 mg Route: Oral Frequency: Daily  Dispense Quantity: 180 tablet Refills: 3    And diclofenac (VOLTAREN) 75 MG EC tablet [[id number]

## 2019-09-16 MED ORDER — DICLOFENAC SODIUM 75 MG PO TBEC: 75.0000 mg | DELAYED_RELEASE_TABLET | Freq: Two times a day (BID) | ORAL | 1 refills | Status: DC

## 2019-09-16 MED ORDER — FUROSEMIDE 20 MG PO TABS: 40.0000 mg | ORAL_TABLET | Freq: Every day | ORAL | 1 refills | Status: DC

## 2019-09-16 MED ORDER — SILVER SULFADIAZINE 1 % EX CREA: 1.0000 "application " | TOPICAL_CREAM | Freq: Every day | CUTANEOUS | 2 refills | Status: DC

## 2019-09-16 NOTE — Telephone Encounter (Signed)
[facility] to [facility] no longer accepting her insurance   furosemide (LASIX) 20 MG tablet  silver sulfADIAZINE (SILVADENE) 1 % cream  diclofenac (VOLTAREN) 75 MG EC tablet   [id number] [place] [place] [facility] [address]. Phone:  [phone]  Fax:  [phone]     Please advise when transferred

## 2019-09-16 NOTE — BH Specialist Note (Deleted)
Integrated Behavioral Health via Telemedicine Video (Caregility) Visit  09/16/2019 Michele Ayers [id number]  Number of Integrated Behavioral Health visits: 1 Session Start time: 2:15***  Session End time: 3:15*** Total time: {IBH Total Time:21014050} minutes  Referring Provider: Emeterio Reeve, MD Type of Service: Individual, Family, *** Patient/Family location: [facility] Provider location: Center for [name] at [facility]  All persons participating in visit: Patient *** and [place] ***    I connected with Abrea Henle and/or [name] {family members:20773} by a video enabled telemedicine application (Caregility) and verified that I am speaking with the correct person using two identifiers.   Discussed confidentiality: {YES/NO:21197}  Confirmed demographics & insurance:  Yes ***  I discussed that engaging in this virtual visit, they consent to the provision of behavioral healthcare and the services will be billed under their insurance.   Patient and/or legal guardian expressed understanding and consented to virtual visit: Yes   PRESENTING CONCERNS: Patient and/or family reports the following symptoms/concerns: *** Duration of problem: ***; Severity of problem: {Mild/Moderate/Severe:20260}  STRENGTHS (Protective Factors/Coping Skills): {CHL AMB BH PROTECTIVE FACTORS/STRENGTHS:3255905085}  ASSESSMENT: Patient currently experiencing ***.    GOALS ADDRESSED: Patient will: 1.  Reduce symptoms of: {IBH Symptoms:21014056}  2.  Increase knowledge and/or ability of: {IBH Patient Tools:21014057}  3.  Demonstrate ability to: {IBH Goals:21014053}  Progress of Goals: {CHL AMB BH PROGRESS TOWARDS OEUMP:5361443154}  INTERVENTIONS: Interventions utilized:  {IBH Interventions:21014054} Standardized Assessments completed & reviewed: {IBH Screening Tools:21014051}   OUTCOME: Patient Response: ***   PLAN: 1. Follow up with  behavioral health clinician on : *** 2. Behavioral recommendations:  -*** -*** 3. Referral(s): {IBH Referrals:21014055}  I discussed the assessment and treatment plan with the patient and/or parent/guardian. They were provided an opportunity to ask questions and all were answered. They agreed with the plan and demonstrated an understanding of the instructions.   They were advised to call back or seek an in-person evaluation as appropriate.  I discussed that the purpose of this visit is to provide behavioral health care while limiting exposure to the novel coronavirus.  Discussed there is a possibility of technology failure and discussed alternative modes of communication if that failure occurs.  Caroleen Hamman [facility]  Depression screen [facility] 2/9 09/04/2019 08/15/2019 07/18/2019 07/15/2019 05/28/2019  Decreased Interest 2 0 0 0 0  Down, Depressed, Hopeless 3 0 0 0 0  PHQ - 2 Score 5 0 0 0 0  Altered sleeping 3 - - - -  Tired, decreased energy 3 - - - -  Change in appetite 3 - - - -  Feeling bad or failure about yourself  2 - - - -  Trouble concentrating 1 - - - -  Moving slowly or fidgety/restless 2 - - - -  Suicidal thoughts 2 - - - -  PHQ-9 Score 21 - - - -   GAD 7 : Generalized Anxiety Score 09/04/2019 08/15/2019  Nervous, Anxious, on Edge 2 0  Control/stop worrying 3 0  Worry too much - different things 3 0  Trouble relaxing 3 0  Restless 1 0  Easily annoyed or irritable 3 0  Afraid - awful might happen 1 0  Total GAD 7 Score 16 0  Anxiety Difficulty - Not difficult at all    ***

## 2019-09-25 ENCOUNTER — Ambulatory Visit
Admission: RE | Admit: 2019-09-25 | Discharge: 2019-09-25 | Disposition: A | Discharge status: Home / Self Care | Payer: No Typology Code available for payment source | Source: Ambulatory Visit | Attending: Obstetrics & Gynecology | Admitting: Obstetrics & Gynecology

## 2019-09-25 DIAGNOSIS — N951 Menopausal and female climacteric states: Secondary | ICD-10-CM

## 2019-09-25 DIAGNOSIS — Z01419 Encounter for gynecological examination (general) (routine) without abnormal findings: Secondary | ICD-10-CM

## 2019-09-25 DIAGNOSIS — N938 Other specified abnormal uterine and vaginal bleeding: Secondary | ICD-10-CM

## 2019-09-30 MED ORDER — FUROSEMIDE 20 MG PO TABS: 40.0000 mg | ORAL_TABLET | Freq: Every day | ORAL | 1 refills | Status: DC

## 2019-09-30 MED ORDER — DICLOFENAC SODIUM 75 MG PO TBEC: 75.0000 mg | DELAYED_RELEASE_TABLET | Freq: Two times a day (BID) | ORAL | 1 refills | Status: DC

## 2019-09-30 MED ORDER — SILVER SULFADIAZINE 1 % EX CREA: 1.0000 "application " | TOPICAL_CREAM | Freq: Every day | CUTANEOUS | 2 refills | Status: DC

## 2019-09-30 NOTE — Telephone Encounter (Signed)
[facility] and [facility] [address]

## 2019-09-30 NOTE — Addendum Note (Signed)
Addended by: Denman George on: 09/30/2019 09:49 AM   Modules accepted: Orders

## 2019-09-30 NOTE — Telephone Encounter (Signed)
Per pt. Request, will send Rx's on Furosemide, Silvadene Cream, and [facility] [address]

## 2019-10-02 ENCOUNTER — Encounter: Payer: Self-pay | Admitting: Obstetrics & Gynecology

## 2019-10-02 ENCOUNTER — Ambulatory Visit (INDEPENDENT_AMBULATORY_CARE_PROVIDER_SITE_OTHER): Payer: No Typology Code available for payment source | Admitting: Obstetrics & Gynecology

## 2019-10-02 ENCOUNTER — Other Ambulatory Visit: Payer: Self-pay

## 2019-10-02 VITALS — Wt 356.9 lb

## 2019-10-02 DIAGNOSIS — N938 Other specified abnormal uterine and vaginal bleeding: Secondary | ICD-10-CM

## 2019-10-02 NOTE — Progress Notes (Signed)
Subjective:     Patient ID: Michele Ayers, female   DOB: 10-May-1965, 54 y.o.   MRN: [id number] Cc: f/u results HPI [id number] No LMP recorded. (Menstrual status: Irregular Periods). She still has heavy bleeding daily which increased when her [place] was done last week, many pads a day. Current Outpatient Medications on File Prior to Visit  Medication Sig Dispense Refill   aspirin EC 81 MG tablet Take 81 mg by mouth daily. Swallow whole.     diclofenac (VOLTAREN) 75 MG EC tablet Take 1 tablet (75 mg total) by mouth 2 (two) times daily. 60 tablet 1   furosemide (LASIX) 20 MG tablet Take 2 tablets (40 mg total) by mouth daily. 180 tablet 1   medroxyPROGESTERone (PROVERA) 10 MG tablet Take 2 tablets (20 mg total) by mouth daily. 60 tablet 2   silver sulfADIAZINE (SILVADENE) 1 % cream Apply 1 application topically daily. 50 g 2   No current facility-administered medications on file prior to visit.     Review of Systems  Constitutional: Negative.   Genitourinary: Positive for menstrual problem and vaginal bleeding. Negative for pelvic pain.       Objective:   Physical Exam Vitals and nursing note reviewed.  Constitutional:      Appearance: She is not ill-appearing.  Pulmonary:     Effort: Pulmonary effort is normal.  Skin:    Coloration: Skin is not pale.  Neurological:     Mental Status: She is alert.  Psychiatric:        Mood and Affect: Mood normal.        Behavior: Behavior normal.    CBC    Component Value Date/Time   WBC 9.0 09/04/2019 1107   WBC 9.1 07/15/2019 0943   WBC 10.7 (H) 06/11/2015 1700   RBC 4.53 09/04/2019 1107   RBC 4.30 07/15/2019 0943   RBC 4.07 06/11/2015 1700   HGB 14.2 09/04/2019 1107   HCT 42.8 09/04/2019 1107   PLT 375 09/04/2019 1107   MCV 95 09/04/2019 1107   MCH 31.3 09/04/2019 1107   MCH 31.3 (A) 07/15/2019 0943   MCH 32.4 06/11/2015 1700   MCHC 33.2 09/04/2019 1107   MCHC 32.8 07/15/2019 0943   MCHC 33.2 06/11/2015 1700   RDW 12.3  09/04/2019 1107   LYMPHSABS 3.0 01/03/2019 1048   MONOABS 0.9 06/11/2015 1700   EOSABS 0.2 01/03/2019 1048   BASOSABS 0.0 01/03/2019 1048   FSH 10 CLINICAL DATA:  Dysfunctional uterine bleeding  EXAM: TRANSABDOMINAL AND TRANSVAGINAL ULTRASOUND OF PELVIS  TECHNIQUE: Both transabdominal and transvaginal ultrasound examinations of the pelvis were performed. Transabdominal technique was performed for global imaging of the pelvis including uterus, ovaries, adnexal regions, and pelvic cul-de-sac. It was necessary to proceed with endovaginal exam following the transabdominal exam to visualize the endometrium.  COMPARISON:  None  FINDINGS: Uterus  Measurements: 11.2 x 5.2 x 4.9 cm = volume: 148 mL. No fibroids or other mass visualized.  Endometrium  Thickness: 19 mm in thickness, heterogeneous. No focal abnormality visualized.  Right ovary  Measurements: Not visualized.  No adnexal mass seen  Left ovary  Measurements: Not visualized.  No adnexal mass seen.  Other findings  No abnormal free fluid.  IMPRESSION: Endometrium thickened and heterogeneous, measuring 19 mm. In the setting of post-menopausal bleeding, endometrial sampling is indicated to exclude carcinoma. If results are benign, sonohysterogram should be considered for focal lesion work-up. (Ref: Radiological Reasoning: Algorithmic Workup of Abnormal Vaginal Bleeding with Endovaginal Sonography and Sonohysterography. AJR  2008; 998:S01-23)   Electronically Signed   By: Rolm Baptise M.D.   On: 09/25/2019 15:22      Assessment:     DUB perimenopausal Thickened endometrium    Plan:     DUB (dysfunctional uterine bleeding) Added Premarin to Provera  RTC for endometrial biopsy  [name], MD 10/02/2019

## 2019-10-02 NOTE — Progress Notes (Signed)
PHQ-9 and GAD-7 positive. Denies SI today, but endorses SI during past two weeks. No plan for harm. Scheduled with [facility] 10/16/19.  Apolonio Schneiders RN 10/02/19

## 2019-10-02 NOTE — Patient Instructions (Signed)
Endometrial Biopsy  Endometrial biopsy is a procedure in which a tissue sample is taken from inside the uterus. The sample is taken from the endometrium, which is the lining of the uterus. The tissue sample is then checked under a microscope to see if the tissue is normal or abnormal. This procedure helps to determine where you are in your menstrual cycle and how hormone levels are affecting the lining of the uterus. This procedure may also be used to evaluate uterine bleeding or to diagnose endometrial cancer, endometrial tuberculosis, polyps, or other inflammatory conditions. Tell a health care provider about:  Any allergies you have.  All medicines you are taking, including vitamins, herbs, eye drops, creams, and over-the-counter medicines.  Any problems you or family members have had with anesthetic medicines.  Any blood disorders you have.  Any surgeries you have had.  Any medical conditions you have.  Whether you are pregnant or may be pregnant. What are the risks? Generally, this is a safe procedure. However, problems may occur, including:  Bleeding.  Pelvic infection.  Puncture of the wall of the uterus with the biopsy device (rare). What happens before the procedure?  Keep a record of your menstrual cycles as told by your health care provider. You may need to schedule your procedure for a specific time in your cycle.  You may want to bring a sanitary pad to wear after the procedure.  Ask your health care provider about: ? Changing or stopping your regular medicines. This is especially important if you are taking diabetes medicines or blood thinners. ? Taking medicines such as aspirin and ibuprofen. These medicines can thin your blood. Do not take these medicines before your procedure if your health care provider instructs you not to.  Plan to have someone take you home from the hospital or clinic. What happens during the procedure?  To lower your risk of  infection: ? Your health care team will wash or sanitize their hands.  You will lie on an exam table with your feet and legs supported as in a pelvic exam.  Your health care provider will insert an instrument (speculum) into your vagina to see your cervix.  Your cervix will be cleansed with an antiseptic solution.  A medicine (local anesthetic) will be used to numb the cervix.  A forceps instrument (tenaculum) will be used to hold your cervix steady for the biopsy.  A thin, rod-like instrument (uterine sound) will be inserted through your cervix to determine the length of your uterus and the location where the biopsy sample will be removed.  A thin, flexible tube (catheter) will be inserted through your cervix and into the uterus. The catheter will be used to collect the biopsy sample from your endometrial tissue.  The catheter and speculum will then be removed, and the tissue sample will be sent to a lab for examination. What happens after the procedure?  You will rest in a recovery area until you are ready to go home.  You may have mild cramping and a small amount of vaginal bleeding. This is normal.  It is up to you to get the results of your procedure. Ask your health care provider, or the department that is doing the procedure, when your results will be ready. Summary  Endometrial biopsy is a procedure in which a tissue sample is taken from the endometrium, which is the lining of the uterus.  This procedure may help to diagnose menstrual cycle problems, abnormal bleeding, or other conditions affecting   the endometrium.  Before the procedure, keep a record of your menstrual cycles as told by your health care provider.  The tissue sample that is removed will be checked under a microscope to see if it is normal or abnormal. This information is not intended to replace advice given to you by your health care provider. Make sure you discuss any questions you have with your health care  provider. Document Revised: 12/02/2016 Document Reviewed: 01/06/2016 Elsevier Patient Education  2020 Elsevier Inc.  

## 2019-10-04 ENCOUNTER — Telehealth (INDEPENDENT_AMBULATORY_CARE_PROVIDER_SITE_OTHER): Payer: No Typology Code available for payment source | Admitting: Lactation Services

## 2019-10-04 DIAGNOSIS — N939 Abnormal uterine and vaginal bleeding, unspecified: Secondary | ICD-10-CM

## 2019-10-04 NOTE — Telephone Encounter (Signed)
Patient called and LM on nurse voicemail that she needs refill on Provera. Patient has refills available at [facility] on [address].   Patient reports MD was to add Premarin and prescription not at pharmacy. Will reach out to Dr. Roselie Awkward to inquire.   Called patient to let her know that she has refills at [facility] [address] and [facility]   Informed her I would reach out to Dr. Roselie Awkward about the Premarin. Patient voiced understanding.

## 2019-10-07 ENCOUNTER — Ambulatory Visit
Admission: RE | Admit: 2019-10-07 | Discharge: 2019-10-07 | Disposition: A | Discharge status: Home / Self Care | Payer: No Typology Code available for payment source | Source: Ambulatory Visit | Attending: Obstetrics & Gynecology | Admitting: Obstetrics & Gynecology

## 2019-10-07 ENCOUNTER — Telehealth: Payer: Self-pay | Admitting: *Deleted

## 2019-10-07 ENCOUNTER — Other Ambulatory Visit: Payer: Self-pay

## 2019-10-07 DIAGNOSIS — Z1239 Encounter for other screening for malignant neoplasm of breast: Secondary | ICD-10-CM

## 2019-10-07 NOTE — Telephone Encounter (Addendum)
Pt left VM message stating that she needs refill of medroxyprogesterone. Also, Dr. Roselie Awkward told her that he would send a prescription for estrogen which is not @ her pharmacy. [facility] [address] Per chart review, pt has 2 available refills for Medroxyprogesterone however the Rx was originally sent to [facility] on [address]. I returned pt's call and advised her that she has refills available for Provera. Pt stated that she picked it up yesterday. I added that I will speak with Dr. Roselie Awkward later today regarding the Rx for Premarin. Pt voiced understanding.

## 2019-10-08 MED ORDER — ESTROGENS CONJUGATED 1.25 MG PO TABS: 2.5000 mg | ORAL_TABLET | Freq: Every day | ORAL | 1 refills | Status: DC

## 2019-10-15 NOTE — BH Specialist Note (Addendum)
Integrated Behavioral Health via Telemedicine Video (Caregility) Visit  10/15/2019 Michele Ayers [id number]  Number of Integrated Behavioral Health visits: 1 Session Start time: 1:50 Session End time: 2:50 Total time: 60 minutes  Referring Provider: Emeterio Reeve, MD Type of Service: Individual Patient/Family location: Home [facility] Provider location: Center for [place] [facility]  All persons participating in visit: Patient Michele Ayers and [place]     I connected with Michele Ayers  by a video enabled telemedicine application (Caregility) and verified that I am speaking with the correct person using two identifiers.   Discussed confidentiality: Yes   Confirmed demographics & insurance:  Yes   I discussed that engaging in this virtual visit, they consent to the provision of behavioral healthcare and the services will be billed under their insurance.   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 Pt denies any current SI; no intent and no plan; no HI.  Duration of problem: Ongoing; Severity of problem: severe  STRENGTHS (Protective Factors/Coping Skills): Recognizing a need for greater self-care for overall health and wellness  ASSESSMENT: Patient currently experiencing Major depressive disorder, single episode, moderate and  Psychosocial stress .    GOALS ADDRESSED: Patient will: 1.  Reduce symptoms of: anxiety, depression and stress  2.  Increase knowledge and/or ability of: healthy habits and stress reduction  3.  Demonstrate ability to: Increase healthy adjustment to current life circumstances, Increase adequate support systems for patient/family and Increase motivation to adhere to plan of care   Progress of Goals: Ongoing  INTERVENTIONS: Interventions utilized:  Mindfulness or Psychologist, educational, Psychoeducation and/or Health Education and Link to [employer] Standardized Assessments completed & reviewed: PHQ9/GAD7 given in past two weeks   OUTCOME: Patient Response: Pt agrees to treatment plan   PLAN: 1. Follow up with behavioral health clinician on : Two weeks 2. Behavioral recommendations:  -CALM relaxation breathing exercise at least one minute daily -Call within the next two weeks to schedule a massage (via gift card) -Consider prioritizing healthy sleep one night in the next two weeks by saying "no" to family for one day -Consider attending online Caregiver Support group (website on AVS) 3. Referral(s): [place] (In Clinic)  I discussed the assessment and treatment plan with the patient and/or parent/guardian. They were provided an opportunity to ask questions and all were answered. They agreed with the plan and demonstrated an understanding of the instructions.   They were advised to call back or seek an in-person evaluation as appropriate.  I discussed that the purpose of this visit is to provide behavioral health care while limiting exposure to the novel coronavirus.  Discussed there is a possibility of technology failure and discussed alternative modes of communication if that failure occurs.  Caroleen Hamman [facility]  Depression screen [facility] 2/9 10/02/2019 09/04/2019 08/15/2019 07/18/2019 07/15/2019  Decreased Interest 2 2 0 0 0  Down, Depressed, Hopeless 2 3  0 0 0  PHQ - 2 Score 4 5 0 0 0  Altered sleeping 3 3 - - -  Tired, decreased energy 2 3 - - -  Change in appetite 2 3 - - -  Feeling bad or failure about yourself  2 2 - - -  Trouble concentrating 2 1 - - -  Moving slowly or fidgety/restless 1 2 - - -  Suicidal thoughts 1 2 - - -  PHQ-9 Score 17 21 - - -   GAD 7 : Generalized Anxiety Score 10/02/2019 09/04/2019 08/15/2019  Nervous, Anxious, on Edge 2 2 0  Control/stop worrying 2 3 0  Worry too much - different things 2 3 0  Trouble relaxing 2 3 0  Restless 1 1 0  Easily annoyed or irritable 2 3 0  Afraid - awful might happen 1 1 0  Total GAD 7 Score 12 16 0  Anxiety Difficulty - - Not difficult at all

## 2019-10-16 ENCOUNTER — Ambulatory Visit (INDEPENDENT_AMBULATORY_CARE_PROVIDER_SITE_OTHER): Payer: No Typology Code available for payment source | Admitting: Clinical

## 2019-10-16 DIAGNOSIS — F321 Major depressive disorder, single episode, moderate: Secondary | ICD-10-CM

## 2019-10-16 DIAGNOSIS — F32A Depression, unspecified: Secondary | ICD-10-CM | POA: Diagnosis not present

## 2019-10-16 DIAGNOSIS — Z658 Other specified problems related to psychosocial circumstances: Secondary | ICD-10-CM

## 2019-10-16 NOTE — Patient Instructions (Addendum)
Center for [facility] at [place] [facility] [place], [place] [address] [phone] (main office) [phone] [facility] [place] office)   Myrical, you may be interested in a caregiver support group, found at this site:  https://[email or web address]-group/   /Emotional [employer] and Websites Here are a few free apps meant to help you to help yourself.  To find, try searching on the internet to see if the app is offered on Apple/Android devices. If your first choice doesn't come up on your device, the good news is that there are many choices! Play around with different apps to see which ones are helpful to you.    Calm This is an app meant to help increase calm feelings. Includes info, strategies, and tools for tracking your feelings.      Calm Harm  This app is meant to help with self-harm. Provides many 5-minute or 15-min coping strategies for doing instead of hurting yourself.       [place] is a problem-solving tool to help deal with emotions and cope with stress you encounter wherever you are.      MindShift This app can help people cope with anxiety. Rather than trying to avoid anxiety, you can make an important shift and face it.      MY3  MY3 features a support system, safety plan and resources with the goal of offering a tool to use in a time of need.       My Life My Voice  This mood journal offers a simple solution for tracking your thoughts, feelings and moods. Animated emoticons can help identify your mood.       Relax Melodies Designed to help with sleep, on this app you can mix sounds and meditations for relaxation.      Smiling Mind Smiling Mind is meditation made easy: it's a simple tool that helps put a smile on your mind.        Stop, Breathe & Think  A friendly, simple guide for people through meditations for mindfulness and compassion.  Stop,  Breathe and Think Kids Enter your current feelings and choose a "mission" to help you cope. Offers videos for certain moods instead of just sound recordings.       Team Orange The goal of this tool is to help teens change how they think, act, and react. This app helps you focus on your own good feelings and experiences.      The Ashland Box The [place] Box (VHB) contains simple tools to help patients with coping, relaxation, distraction, and positive thinking.    Behavioral Health Resources:   What if I or someone I know is in crisis?  . If you are thinking about harming yourself or having thoughts of suicide, or if you know someone who is, seek help right away.  . Call your doctor or mental health care provider.  . Call 911 or go to a hospital emergency room to get immediate help, or ask a friend or family member to help you do these things; IF YOU ARE IN [place], YOU MAY GO TO WALK-IN URGENT CARE 24/7 at [facility] (see below)  . Call the [place] National Suicide Prevention Lifeline's toll-free, 24-hour hotline at 1-800-273-TALK 720-582-0703) or TTY: 1-800-799-4 TTY 279-179-6737) to talk to a trained counselor.  . If you are in crisis, make sure you are not left alone.   . If someone else is in crisis, make sure  he or she is not left alone   24 Hour :   [facility]  [address] [place], [place] [address] [phone] or [place] 24/7  Therapeutic Alternative Mobile Crisis: [phone]  [place] National Suicide Hotline: 269-696-0920  Family Service of the [employer] (Domestic Violence, Rape & Victim Assistance)  [phone]  [place]  [address], [place]  [address]   [phone] or [phone]   [place]: [phone] (8am-4pm) or [phone] (after hours)        [facility], [address], [place], [place] Fax: [phone] [email or web address] *Interpreters available *Accepts all insurance and uninsured for Urgent Care needs *Accepts Medicaid and uninsured for outpatient treatment   [facility]   Mon-Fri: 8am-5pm [place], [place], [place]); [phone]) BloggerCourse.com  *Accepts Medicare  [facility] [address] [place], Fri: 8am-4pm [place], [place], [place] [address] [phone] (phone); [phone] (fax) TaskTown.es  *[place] Mon-Fri: 9am-5pm  [address] [place], [place] (phone); [phone]  [email or web address]://www.[email or web address]  *Accepts Medicaid  Jinny Blossom [facility] [address] Johnette Abraham [place], [place] SalonLookup.es   [facility] of the [place], 8:30am-12pm/1pm-2:30pm [address] [place], [place] (phone); [phone] (fax) www.fspcares.org  *Accepts Medicaid, sliding-scale*Bilingual services available  [facility] Mon-Fri, 8am-7pm [place], [place]  [phone](phone); [phone]) www.famsolutions.org  *Accepts Medicaid *Bilingual services available  [facility] Mon-Fri: 8am-5pm, Saturday by appointment only [place], [place], [place] (phone); [phone] (fax) www.[email or web address]   [facility] [address], [place], [place], [place] www.[email or web address]  *Free & reduced services for uninsured and underinsured individuals *Bilingual services for Spanish-speaking clients 21 and under  [facility], [address] [place],  [place]); [phone]) RunningConvention.de  *Bring your own interpreter at first visit *Accepts Medicare and [facility]  [place] Mon-Fri: 9am-5:30pm [address] [place], [place], [place] (phone), [phone] (fax) After hours crisis line: [phone] www.[email or web address]  *Accepts Medicare and Medicaid  [employer], 8am-6pm [address] [place], [place] (phone); [phone] (fax) http://[email or web address]  *Subsidized costs available  [facility] Mon-Fri: 8am-4pm [address], [place], [place] [phone](phone); [phone]) www.psychotherapeuticservices.com  *Accepts Medicaid  [facility] Same day access hours: Mon-Fri, 8:30-3pm Crisis hours: Mon-Fri, 8am-5pm [address] [place], [place] (336) [place] Same day access hours: Mon-Fri, 8:30-3pm Crisis hours: Mon-Fri, 8am-8pm [address], [place], [place] (phone); [phone] (fax) www.[email or web address]  *Accepts Medicaid and Medicare  [facility] [place] Mon, [place], Fri: 9am-9pm Tues, Thurs: 9am-6pm [place], [place], [place] (phone); [phone] (fax) https://[email or web address]  *(Accepts Medicare and Medicaid; payment plans available)*Bilingual services available  [facility] [address], [place], [place] (phone); [phone] (fax) www.[email or web address]   [facility] [address] [place], [place], [place]  [email or web address]  *Bilingual services available  [facility] (Social and Emotional Learning) Mon-Thurs: 8am-8pm [address] [place], [place], [place] (phone); [phone] (fax) [email or web address]  *Accepts Medicaid*Bilingual services  available  [facility] [place]. [place], [place] (phone) DeadConnect.com.cy  *Accepts Medicaid *Bilingual services available  [facility] Mon-Fri, 9am-4:45pm [address] [place], [place] (phone); [phone] (fax) http://tlc-[email or web address]  *Accepts Medicare  [facility] Mon-Thurs: 8:30-8pm, Fri: 33:30am-7pm [address], [place], [place] (3rd floor) [phone] (phone); [phone] (fax) VIPinterview.[email or web address]  *Accepts Medicaid; income-based reduced rates available  [facility] Mon-Fri: 8am-5pm [place], [place], [place] [address] [phone] (phone); [phone] (fax) http://www.[email or web address]  *Accepts Medicaid*Bilingual services available   [facility] ([place])  [address], [place] [phone] www.[email or web address]  *Provides direct services to individuals in recovery from mental illness, including support groups, recovery skills classes, and one on one peer support  NAMI [employer] on [place]) [place] helpline: [phone]  NAMI [place] helpline: 502-513-0640 https://namiguilford.org  *A community hub for information relating to local resources and services for the friends and families of individuals living alongside a mental health condition, as well as the individuals themselves. Classes and support groups also provided

## 2019-10-21 NOTE — BH Specialist Note (Deleted)
Integrated Behavioral Health via Telemedicine Video (Caregility) Visit  10/21/2019 Michele Ayers [id number]  Number of Integrated Behavioral Health visits: 2 Session Start time: 2:15***  Session End time: 2:45*** Total time: {IBH Total Time:21014050} minutes  Referring Provider: Emeterio Reeve, MD Type of Service: Individual, Family, *** Patient/Family location: Home [facility] Provider location: Center for [name] at [facility]  All persons participating in visit: Patient *** and Michele Ayers ***    I connected with Michele Ayers and/or Michele Ayers {family members:20773} by a video enabled telemedicine application (Caregility) and verified that I am speaking with the correct person using two identifiers.   Discussed confidentiality: {YES/NO:21197}  Confirmed demographics & insurance:  {YES/NO:21197}  I discussed that engaging in this virtual visit, they consent to the provision of behavioral healthcare and the services will be billed under their insurance.   Patient and/or legal guardian expressed understanding and consented to virtual visit: {YES/NO:21197}  PRESENTING CONCERNS: Patient and/or family reports the following symptoms/concerns: *** Duration of problem: ***; Severity of problem: {Mild/Moderate/Severe:20260}  STRENGTHS (Protective Factors/Coping Skills): {CHL AMB BH PROTECTIVE FACTORS/STRENGTHS:9520155311}  ASSESSMENT: Patient currently experiencing ***.    GOALS ADDRESSED: Patient will: 1.  Reduce symptoms of: {IBH Symptoms:21014056}  2.  Increase knowledge and/or ability of: {IBH Patient Tools:21014057}  3.  Demonstrate ability to: {IBH Goals:21014053}   Progress of Goals: {CHL AMB BH PROGRESS TOWARDS LHTDS:2876811572}  INTERVENTIONS: Interventions utilized:  {IBH Interventions:21014054} Standardized Assessments completed & reviewed: {IBH Screening Tools:21014051}   OUTCOME: Patient Response:  ***   PLAN: 1. Follow up with behavioral health clinician on : *** 2. Behavioral recommendations:  -*** -*** 3. Referral(s): {IBH Referrals:21014055}  I discussed the assessment and treatment plan with the patient and/or parent/guardian. They were provided an opportunity to ask questions and all were answered. They agreed with the plan and demonstrated an understanding of the instructions.   They were advised to call back or seek an in-person evaluation as appropriate.  I discussed that the purpose of this visit is to provide behavioral health care while limiting exposure to the novel coronavirus.  Discussed there is a possibility of technology failure and discussed alternative modes of communication if that failure occurs.  Michele Ayers [facility]  Depression screen [facility] 2/9 10/02/2019 09/04/2019 08/15/2019 07/18/2019 07/15/2019  Decreased Interest 2 2 0 0 0  Down, Depressed, Hopeless 2 3 0 0 0  PHQ - 2 Score 4 5 0 0 0  Altered sleeping 3 3 - - -  Tired, decreased energy 2 3 - - -  Change in appetite 2 3 - - -  Feeling bad or failure about yourself  2 2 - - -  Trouble concentrating 2 1 - - -  Moving slowly or fidgety/restless 1 2 - - -  Suicidal thoughts 1 2 - - -  PHQ-9 Score 17 21 - - -   GAD 7 : Generalized Anxiety Score 10/02/2019 09/04/2019 08/15/2019  Nervous, Anxious, on Edge 2 2 0  Control/stop worrying 2 3 0  Worry too much - different things 2 3 0  Trouble relaxing 2 3 0  Restless 1 1 0  Easily annoyed or irritable 2 3 0  Afraid - awful might happen 1 1 0  Total GAD 7 Score 12 16 0  Anxiety Difficulty - - Not difficult at all    ***

## 2019-10-23 ENCOUNTER — Other Ambulatory Visit: Payer: Self-pay

## 2019-10-23 ENCOUNTER — Ambulatory Visit (INDEPENDENT_AMBULATORY_CARE_PROVIDER_SITE_OTHER): Payer: No Typology Code available for payment source | Admitting: Obstetrics & Gynecology

## 2019-10-23 ENCOUNTER — Other Ambulatory Visit (HOSPITAL_COMMUNITY)
Admission: RE | Admit: 2019-10-23 | Discharge: 2019-10-23 | Disposition: A | Discharge status: Home / Self Care | Payer: No Typology Code available for payment source | Source: Ambulatory Visit | Attending: Obstetrics & Gynecology | Admitting: Obstetrics & Gynecology

## 2019-10-23 ENCOUNTER — Encounter: Payer: Self-pay | Admitting: Obstetrics & Gynecology

## 2019-10-23 VITALS — BP 124/72 | HR 90 | Ht 65.0 in | Wt 359.3 lb

## 2019-10-23 DIAGNOSIS — R9389 Abnormal findings on diagnostic imaging of other specified body structures: Secondary | ICD-10-CM

## 2019-10-23 DIAGNOSIS — N939 Abnormal uterine and vaginal bleeding, unspecified: Secondary | ICD-10-CM | POA: Insufficient documentation

## 2019-10-23 DIAGNOSIS — N951 Menopausal and female climacteric states: Secondary | ICD-10-CM

## 2019-10-23 DIAGNOSIS — Z3202 Encounter for pregnancy test, result negative: Secondary | ICD-10-CM

## 2019-10-23 LAB — POCT PREGNANCY, URINE: Preg Test, Ur: NEGATIVE

## 2019-10-23 NOTE — Progress Notes (Signed)
Cc: DUB  54 y.o. No LMP recorded. (Menstrual status: Irregular Periods). [id number], with DUB that has resolved using provera  She notes sweats while taking Provera. [facility] for [facility]    Patient given informed consent, signed copy in the chart, time out was performed. Appropriate time out taken. . The patient was placed in the lithotomy position and the cervix brought into view with sterile speculum.  Portio of cervix cleansed x 2 with betadine swabs. A long graves speculum was need and dilators were handled with ring forceps for longer reach.  A tenaculum was placed in the anterior lip of the cervix.  The uterus was sounded for depth of 5 cm which may have not passed the endocervix. A pipelle was introduced to into the uterus, suction created,  and a very small sample was obtained. All equipment was removed and accounted for.  The patient tolerated the procedure well.  Imp: Abnormal vaginal bleeding  Perimenopause  Thickened endometrium    Patient given post procedure instructions. The patient will be notified of  Results.If not diagnostic consider hysteroscopy and D&C  [name], MD 10/23/2019

## 2019-10-23 NOTE — Addendum Note (Signed)
Addended by: Bethanne Ginger on: 10/23/2019 03:57 PM   Modules accepted: Orders

## 2019-10-24 ENCOUNTER — Encounter: Payer: Self-pay | Admitting: *Deleted

## 2019-10-25 LAB — SURGICAL PATHOLOGY

## 2019-10-28 ENCOUNTER — Telehealth: Payer: Self-pay | Admitting: Obstetrics & Gynecology

## 2019-10-28 NOTE — Telephone Encounter (Signed)
Patient is requesting a call back to explain what Dr Roselie Awkward wants her to do. She stated she is getting a D and C.

## 2019-10-29 NOTE — Telephone Encounter (Signed)
Called patient back, she verbalized that she already called [place] back and got an appointment with dr Roselie Awkward to discuss this.

## 2019-10-30 ENCOUNTER — Telehealth: Payer: Self-pay | Admitting: Clinical

## 2019-10-30 NOTE — Telephone Encounter (Cosign Needed)
Intern called to reschedule Pt. Appointment due to [facility] being out. Intern scheduled Pt. For 11/04/2019 at 1:45PM. Pt. Confirmed she is available for this appointment.  Michele Ayers (Supervisor: Vesta Mixer)

## 2019-10-31 NOTE — BH Specialist Note (Deleted)
Integrated Behavioral Health via Telemedicine Video (Caregility) Visit  10/31/2019 Michele Ayers [id number]  Number of Integrated Behavioral Health visits: 1 Session Start time: 1:45***  Session End time: 2:45*** Total time: {IBH Total Time:21014050} minutes  Referring Provider: Emeterio Reeve, MD Type of Service: Individual, Family, *** Patient/Family location: Home [name] Provider location: Center for [name] at [facility]  All persons participating in visit: Patient *** and Michele Ayers ***    I connected with Michele Ayers and/or Michele Ayers {family members:20773} by a video enabled telemedicine application (Caregility) and verified that I am speaking with the correct person using two identifiers.   Discussed confidentiality: {YES/NO:21197}  Confirmed demographics & insurance:  {YES/NO:21197}  I discussed that engaging in this virtual visit, they consent to the provision of behavioral healthcare and the services will be billed under their insurance.   Patient and/or legal guardian expressed understanding and consented to virtual visit: {YES/NO:21197}  PRESENTING CONCERNS: Patient and/or family reports the following symptoms/concerns: *** Duration of problem: ***; Severity of problem: {Mild/Moderate/Severe:20260}  STRENGTHS (Protective Factors/Coping Skills): {CHL AMB BH PROTECTIVE FACTORS/STRENGTHS:[phone]}  ASSESSMENT: Patient currently experiencing ***.    GOALS ADDRESSED: Patient will: 1.  Reduce symptoms of: {IBH Symptoms:21014056}  2.  Increase knowledge and/or ability of: {IBH Patient Tools:21014057}  3.  Demonstrate ability to: {IBH Goals:21014053}   Progress of Goals: {CHL AMB BH PROGRESS TOWARDS XIDHW:[id number]}  INTERVENTIONS: Interventions utilized:  {IBH Interventions:21014054} Standardized Assessments completed & reviewed: {IBH Screening Tools:21014051}   OUTCOME: Patient Response:  ***   PLAN: 1. Follow up with behavioral health clinician on : *** 2. Behavioral recommendations: *** 3. Referral(s): {IBH Referrals:21014055}  I discussed the assessment and treatment plan with the patient and/or parent/guardian. They were provided an opportunity to ask questions and all were answered. They agreed with the plan and demonstrated an understanding of the instructions.   They were advised to call back or seek an in-person evaluation as appropriate.  I discussed that the purpose of this visit is to provide behavioral health care while limiting exposure to the novel coronavirus.  Discussed there is a possibility of technology failure and discussed alternative modes of communication if that failure occurs.  Michele Ayers

## 2019-11-11 ENCOUNTER — Encounter: Payer: Self-pay | Admitting: Obstetrics & Gynecology

## 2019-11-11 ENCOUNTER — Ambulatory Visit (INDEPENDENT_AMBULATORY_CARE_PROVIDER_SITE_OTHER): Payer: Self-pay | Admitting: Obstetrics & Gynecology

## 2019-11-11 ENCOUNTER — Other Ambulatory Visit: Payer: Self-pay

## 2019-11-11 VITALS — BP 142/75 | HR 89 | Wt 363.2 lb

## 2019-11-11 DIAGNOSIS — N951 Menopausal and female climacteric states: Secondary | ICD-10-CM

## 2019-11-11 DIAGNOSIS — N939 Abnormal uterine and vaginal bleeding, unspecified: Secondary | ICD-10-CM

## 2019-11-11 NOTE — Patient Instructions (Signed)
Hysteroscopy °Hysteroscopy is a procedure that is used to examine the inside of a woman's womb (uterus). This may be done for various reasons, including: °· To look for lumps (tumors) and other growths in the uterus. °· To evaluate abnormal bleeding, fibroid tumors, polyps, scar tissue (adhesions), or cancer of the uterus. °· To determine the cause of an inability to get pregnant (infertility) or repeated losses of pregnancies (miscarriages). °· To find a lost IUD (intrauterine device). °· To perform a procedure that permanently prevents pregnancy (sterilization). °During this procedure, a thin, flexible tube with a small light and camera (hysteroscope) is used to examine the uterus. The camera sends images to a monitor in the room so that your health care provider can view the inside of your uterus. A hysteroscopy should be done right after a menstrual period to make sure that you are not pregnant. °Tell a health care provider about: °· Any allergies you have. °· All medicines you are taking, including vitamins, herbs, eye drops, creams, and over-the-counter medicines. °· Any problems you or family members have had with the use of anesthetic medicines. °· Any blood disorders you have. °· Any surgeries you have had. °· Any medical conditions you have. °· Whether you are pregnant or may be pregnant. °What are the risks? °Generally, this is a safe procedure. However, problems may occur, including: °· Excessive bleeding. °· Infection. °· Damage to the uterus or other structures or organs. °· Allergic reaction to medicines or fluids that are used in the procedure. °What happens before the procedure? °Staying hydrated °Follow instructions from your health care provider about hydration, which may include: °· Up to 2 hours before the procedure - you may continue to drink clear liquids, such as water, clear fruit juice, black coffee, and plain tea. °Eating and drinking restrictions °Follow instructions from your health care  provider about eating and drinking, which may include: °· 8 hours before the procedure - stop eating solid foods and drink clear liquids only °· 2 hours before the procedure - stop drinking clear liquids. °General instructions °· Ask your health care provider about: °? Changing or stopping your normal medicines. This is important if you take diabetes medicines or blood thinners. °? Taking medicines such as aspirin and ibuprofen. These medicines can thin your blood and cause bleeding. Do not take these medicines for 1 week before your procedure, or as told by your health care provider. °· Do not use any products that contain nicotine or tobacco for 2 weeks before the procedure. This includes cigarettes and e-cigarettes. If you need help quitting, ask your health care provider. °· Medicine may be placed in your cervix the day before the procedure. This medicine causes the cervix to have a larger opening (dilate). The larger opening makes it easier for the hysteroscope to be inserted into the uterus during the procedure. °· Plan to have someone with you for the first 24-48 hours after the procedure, especially if you are given a medicine to make you fall asleep (general anesthetic). °· Plan to have someone take you home from the hospital or clinic. °What happens during the procedure? °· To lower your risk of infection: °? Your health care team will wash or sanitize their hands. °? Your skin will be washed with soap. °? Hair may be removed from the surgical area. °· An IV tube will be inserted into one of your veins. °· You may be given one or more of the following: °? A medicine to help   you relax (sedative). °? A medicine that numbs the area around the cervix (local anesthetic). °? A medicine to make you fall asleep (general anesthetic). °· A hysteroscope will be inserted through your vagina and into your uterus. °· Air or fluid will be used to enlarge your uterus, enabling your health care provider to see your uterus  better. The amount of fluid used will be carefully checked throughout the procedure. °· In some cases, tissue may be gently scraped from inside the uterus and sent to a lab for testing (biopsy). °The procedure may vary among health care providers and hospitals. °What happens after the procedure? °· Your blood pressure, heart rate, breathing rate, and blood oxygen level will be monitored until the medicines you were given have worn off. °· You may have some cramping. You may be given medicines for this. °· You may have bleeding, which varies from light spotting to menstrual-like bleeding. This is normal. °· If you had a biopsy done, it is your responsibility to get the results of your procedure. Ask your health care provider, or the department performing the procedure, when your results will be ready. °Summary °· Hysteroscopy is a procedure that is used to examine the inside of a woman's womb (uterus). °· After the procedure, you may have bleeding, which varies from light spotting to menstrual-like bleeding. This is normal. You may also have cramping. °· Plan to have someone take you home from the hospital or clinic. °This information is not intended to replace advice given to you by your health care provider. Make sure you discuss any questions you have with your health care provider. °Document Revised: 12/02/2016 Document Reviewed: 01/19/2016 °Elsevier Patient Education © 2020 [employer]vier Inc. ° °

## 2019-11-11 NOTE — Progress Notes (Signed)
Patient ID: Michele Ayers, female   DOB: 1965-08-22, 54 y.o.   MRN: [id number] Her endometrial biopsy was not diagnostic as there were only endocervical cells present. Patient desires surgical management with hysteroscopy and dilation and curettage.  The risks of surgery were discussed in detail with the patient including but not limited to: bleeding which may require transfusion or reoperation; infection which may require prolonged hospitalization or re-hospitalization and antibiotic therapy; injury to bowel, bladder, ureters and major vessels or other surrounding organs; formation of adhesions; need for additional procedures including laparotomy or subsequent procedures secondary to abnormal pathology; thromboembolic phenomenon; incisional problems and other postoperative or anesthesia complications.  Patient was told that the likelihood that her condition and symptoms will be treated effectively with this surgical management was very high; the postoperative expectations were also discussed in detail. The patient also understands the alternative treatment options which were discussed in full. All questions were answered.  She was told that she will be contacted by our surgical scheduler regarding the time and date of her surgery; routine preoperative instructions will be given to her by the preoperative nursing team.   She is aware of need for preoperative COVID testing and subsequent quarantine from time of test to time of surgery; she will be given further preoperative instructions at that [place] screening visit.  Printed patient education handouts about the procedure were given to the patient to review at home.  [name], MD 11/11/2019

## 2019-11-11 NOTE — BH Specialist Note (Deleted)
Integrated Behavioral Health via Telemedicine Video (Caregility) Visit  11/11/2019 Michele Ayers [id number]  Number of Integrated Behavioral Health visits: *** Session Start time: ***  Session End time: *** Total time: {IBH Total Time:21014050} minutes  Referring Provider: *** Type of Service: Individual, Family, *** Patient/Family location: *** [facility] Provider location: *** All persons participating in visit: ***   I connected with [name] and/or Butch Penny Cristiano's {family members:20773} by a video enabled telemedicine application (Caregility) and verified that I am speaking with the correct person using two identifiers.   Discussed confidentiality: {YES/NO:21197}  Confirmed demographics & insurance:  {YES/NO:21197}  I discussed that engaging in this virtual visit, they consent to the provision of behavioral healthcare and the services will be billed under their insurance.   Patient and/or legal guardian expressed understanding and consented to virtual visit: {YES/NO:21197}  PRESENTING CONCERNS: Patient and/or family reports the following symptoms/concerns: *** Duration of problem: ***; Severity of problem: {Mild/Moderate/Severe:20260}  STRENGTHS (Protective Factors/Coping Skills): {CHL AMB BH PROTECTIVE FACTORS/STRENGTHS:[phone]}  ASSESSMENT: Patient currently experiencing ***.    GOALS ADDRESSED: Patient will: 1.  Reduce symptoms of: {IBH Symptoms:21014056}  2.  Increase knowledge and/or ability of: {IBH Patient Tools:21014057}  3.  Demonstrate ability to: {IBH Goals:21014053}   Progress of Goals: {CHL AMB BH PROGRESS TOWARDS IZTIW:[id number]}  INTERVENTIONS: Interventions utilized:  {IBH Interventions:21014054} Standardized Assessments completed & reviewed: {IBH Screening Tools:21014051}   OUTCOME: Patient Response: ***   PLAN: 1. Follow up with behavioral health clinician on : *** 2. Behavioral recommendations: *** 3. Referral(s): {IBH  Referrals:21014055}  I discussed the assessment and treatment plan with the patient and/or parent/guardian. They were provided an opportunity to ask questions and all were answered. They agreed with the plan and demonstrated an understanding of the instructions.   They were advised to call back or seek an in-person evaluation as appropriate.  I discussed that the purpose of this visit is to provide behavioral health care while limiting exposure to the novel coronavirus.  Discussed there is a possibility of technology failure and discussed alternative modes of communication if that failure occurs.  Caroleen Hamman Jaydis Duchene

## 2019-11-12 NOTE — BH Specialist Note (Signed)
Integrated Behavioral Health via Telemedicine Video (Caregility) Visit  11/12/2019 Michele Ayers [id number]  Number of Integrated Behavioral Health visits: 2 Session Start time: 1:58  Session End time: 2:22 Total time: 24 minutes  Referring Provider: Emeterio Reeve, MD Type of Service: Individual Patient/Family location: Home [facility] Provider location: Center for [place] at [facility]  All persons participating in visit: Patient Michele Ayers and [name]     I connected with Michele Ayers  by a video enabled telemedicine application (Caregility) and verified that I am speaking with the correct person using two identifiers.   Discussed confidentiality: at previous visit  Confirmed demographics & insurance:  Yes   I discussed that engaging in this virtual visit, they consent to the provision of behavioral healthcare and the services will be billed under their insurance.   Patient and/or legal guardian expressed understanding and consented to virtual visit: Yes   PRESENTING CONCERNS: Patient and/or family reports the following symptoms/concerns: Pt states her primary concerns today are having surgery tomorrow while uninsured, interpersonal conflict with her boyfriend, and "snapping on people". Pt has noticed routinely going between 48-72 hours with no sleep, and no fatigue; family history of bipolar disorder (paternal grandmother and 2 paternal cousins).  Duration of problem: Ongoing; Severity of problem: severe  STRENGTHS (Protective Factors/Coping Skills): Motivated for change; recognizing a greater need for self-care for overall improved health and wellness  ASSESSMENT: Patient currently experiencing Major depressive disorder, single episode, moderate and Psychosocial stress. Pt would benefit from further assessment. Marland Kitchen    GOALS ADDRESSED: Patient will: 1.  Reduce symptoms of: anxiety, depression, insomnia and stress  2.  Demonstrate  ability to: Increase motivation to adhere to plan of care   Progress of Goals: Ongoing  INTERVENTIONS: Interventions utilized:  Solution-Focused Strategies and Psychoeducation and/or Health Education Standardized Assessments completed & reviewed: MDQ (12 YES to questions 1-13; YES to number 2; "no problem" to number 3)   OUTCOME: Patient Response: Pt agrees to treatment plan   PLAN: 1. Follow up with behavioral health clinician on : Three weeks 2. Behavioral recommendations:  -Accept referral to psychiatry -Ask for [place] paperwork prior to surgery tomorrow -Continue using CALM relaxation breathing exercise daily, for as long as remains helpful 3. Referral(s): Integrated Orthoptist (In Clinic) and [facility] (LME/Outside Clinic)  I discussed the assessment and treatment plan with the patient and/or parent/guardian. They were provided an opportunity to ask questions and all were answered. They agreed with the plan and demonstrated an understanding of the instructions.   They were advised to call back or seek an in-person evaluation as appropriate.  I discussed that the purpose of this visit is to provide behavioral health care while limiting exposure to the novel coronavirus.  Discussed there is a possibility of technology failure and discussed alternative modes of communication if that failure occurs.  Caroleen Hamman Shail Urbas

## 2019-11-15 ENCOUNTER — Telehealth: Payer: Self-pay

## 2019-11-15 NOTE — Telephone Encounter (Signed)
Pt called Nurse line concerned that she was not told that after her COVID test tomorrow, that she can not go out until her Surgery on Tuesday. Called received today at 8:37am.  Called Pt to explain the reason why she is expected to stay home until her surgery, which is to ensure that she does not get sick in between time. Pt verbalized understanding.

## 2019-11-16 ENCOUNTER — Other Ambulatory Visit (HOSPITAL_COMMUNITY)
Admission: RE | Admit: 2019-11-16 | Discharge: 2019-11-16 | Disposition: A | Discharge status: Home / Self Care | Payer: No Typology Code available for payment source | Source: Ambulatory Visit | Attending: Obstetrics & Gynecology | Admitting: Obstetrics & Gynecology

## 2019-11-16 DIAGNOSIS — Z01812 Encounter for preprocedural laboratory examination: Secondary | ICD-10-CM | POA: Diagnosis not present

## 2019-11-16 DIAGNOSIS — Z20822 Contact with and (suspected) exposure to covid-19: Secondary | ICD-10-CM | POA: Diagnosis not present

## 2019-11-16 LAB — SARS CORONAVIRUS 2 (TAT 6-24 HRS): SARS Coronavirus 2: NEGATIVE

## 2019-11-18 ENCOUNTER — Ambulatory Visit (INDEPENDENT_AMBULATORY_CARE_PROVIDER_SITE_OTHER): Payer: Self-pay | Admitting: Clinical

## 2019-11-18 ENCOUNTER — Encounter (HOSPITAL_COMMUNITY): Payer: Self-pay | Admitting: Obstetrics & Gynecology

## 2019-11-18 DIAGNOSIS — F321 Major depressive disorder, single episode, moderate: Secondary | ICD-10-CM

## 2019-11-18 DIAGNOSIS — Z658 Other specified problems related to psychosocial circumstances: Secondary | ICD-10-CM

## 2019-11-18 NOTE — Progress Notes (Signed)
PCP:  Horald Pollen, MD Cardiologist:  Denies  EKG:  N/A CXR:  03/23/15 ECHO:  Denies Stress Test:  Denies Cardiac Cath:  Denies  Fasting Blood Sugar- N/A Checks Blood Sugar__N/A_ times a day  ASA:  Yes.  Last dose 11/16/19 Blood Thinners: NO  OSA/CPAP:  No  POCT Preg DOS.  Patient needs Hover Mat.  [employer] message sent to dr. Roselie Awkward requesting orders.   Anesthesia Review: No.    Patient denies shortness of breath, fever, cough, and chest pain at PAT appointment.  Patient verbalized understanding of instructions provided today at the PAT appointment.  Patient asked to review instructions at home and day of surgery.

## 2019-11-19 ENCOUNTER — Other Ambulatory Visit: Payer: Self-pay | Admitting: Obstetrics & Gynecology

## 2019-11-19 ENCOUNTER — Ambulatory Visit (HOSPITAL_COMMUNITY): Payer: No Typology Code available for payment source | Admitting: Anesthesiology

## 2019-11-19 ENCOUNTER — Encounter (HOSPITAL_COMMUNITY): Payer: Self-pay | Admitting: Obstetrics & Gynecology

## 2019-11-19 ENCOUNTER — Ambulatory Visit (HOSPITAL_COMMUNITY)
Admission: RE | Admit: 2019-11-19 | Discharge: 2019-11-19 | Disposition: A | Discharge status: Home / Self Care | Payer: No Typology Code available for payment source | Attending: Obstetrics & Gynecology | Admitting: Obstetrics & Gynecology

## 2019-11-19 ENCOUNTER — Encounter (HOSPITAL_COMMUNITY)
Admission: RE | Disposition: A | Discharge status: Home / Self Care | Payer: Self-pay | Source: Home / Self Care | Attending: Obstetrics & Gynecology

## 2019-11-19 DIAGNOSIS — Z780 Asymptomatic menopausal state: Secondary | ICD-10-CM | POA: Diagnosis not present

## 2019-11-19 DIAGNOSIS — Z7982 Long term (current) use of aspirin: Secondary | ICD-10-CM | POA: Diagnosis not present

## 2019-11-19 DIAGNOSIS — N939 Abnormal uterine and vaginal bleeding, unspecified: Secondary | ICD-10-CM

## 2019-11-19 DIAGNOSIS — Z87891 Personal history of nicotine dependence: Secondary | ICD-10-CM | POA: Insufficient documentation

## 2019-11-19 DIAGNOSIS — N950 Postmenopausal bleeding: Secondary | ICD-10-CM

## 2019-11-19 DIAGNOSIS — Z79899 Other long term (current) drug therapy: Secondary | ICD-10-CM | POA: Insufficient documentation

## 2019-11-19 HISTORY — PX: HYSTEROSCOPY WITH D & C: SHX1775

## 2019-11-19 LAB — POCT I-STAT, CHEM 8
BUN: 11 mg/dL (ref 6–20)
Calcium, Ion: 1.02 mmol/L — ABNORMAL LOW (ref 1.15–1.40)
Chloride: 99 mmol/L (ref 98–111)
Creatinine, Ser: 0.5 mg/dL (ref 0.44–1.00)
Glucose, Bld: 91 mg/dL (ref 70–99)
HCT: 41 % (ref 36.0–46.0)
Hemoglobin: 13.9 g/dL (ref 12.0–15.0)
Potassium: 4.4 mmol/L (ref 3.5–5.1)
Sodium: 136 mmol/L (ref 135–145)
TCO2: 25 mmol/L (ref 22–32)

## 2019-11-19 LAB — POCT PREGNANCY, URINE: Preg Test, Ur: NEGATIVE

## 2019-11-19 SURGERY — DILATATION AND CURETTAGE /HYSTEROSCOPY
Anesthesia: General | Site: Uterus

## 2019-11-19 MED ORDER — DEXAMETHASONE SODIUM PHOSPHATE 10 MG/ML IJ SOLN
INTRAMUSCULAR | Status: DC | PRN
  Administered 2019-11-19: 10 mg via INTRAVENOUS

## 2019-11-19 MED ORDER — OXYCODONE HCL 5 MG/5ML PO SOLN: 5.0000 mg | Freq: Once | ORAL | Status: DC | PRN

## 2019-11-19 MED ORDER — LIDOCAINE 2% (20 MG/ML) 5 ML SYRINGE
INTRAMUSCULAR | Status: DC | PRN
  Administered 2019-11-19: 30 mg via INTRAVENOUS

## 2019-11-19 MED ORDER — DEXAMETHASONE SODIUM PHOSPHATE 10 MG/ML IJ SOLN
INTRAMUSCULAR | Status: AC
  Filled 2019-11-19: qty 1

## 2019-11-19 MED ORDER — MIDAZOLAM HCL 2 MG/2ML IJ SOLN
INTRAMUSCULAR | Status: AC
  Filled 2019-11-19: qty 2

## 2019-11-19 MED ORDER — LACTATED RINGERS IV SOLN: INTRAVENOUS | Status: DC | PRN

## 2019-11-19 MED ORDER — LACTATED RINGERS IV SOLN: INTRAVENOUS | Status: DC

## 2019-11-19 MED ORDER — LIDOCAINE 2% (20 MG/ML) 5 ML SYRINGE
INTRAMUSCULAR | Status: AC
  Filled 2019-11-19: qty 5

## 2019-11-19 MED ORDER — PROPOFOL 10 MG/ML IV BOLUS
INTRAVENOUS | Status: AC
  Filled 2019-11-19: qty 20

## 2019-11-19 MED ORDER — FENTANYL CITRATE (PF) 250 MCG/5ML IJ SOLN
INTRAMUSCULAR | Status: AC
  Filled 2019-11-19: qty 5

## 2019-11-19 MED ORDER — ROCURONIUM BROMIDE 10 MG/ML (PF) SYRINGE
PREFILLED_SYRINGE | INTRAVENOUS | Status: AC
  Filled 2019-11-19: qty 10

## 2019-11-19 MED ORDER — ORAL CARE MOUTH RINSE: 15.0000 mL | Freq: Once | OROMUCOSAL | Status: AC

## 2019-11-19 MED ORDER — IBUPROFEN 600 MG PO TABS: 600.0000 mg | ORAL_TABLET | Freq: Four times a day (QID) | ORAL | 1 refills | Status: AC | PRN

## 2019-11-19 MED ORDER — OXYCODONE-ACETAMINOPHEN 5-325 MG PO TABS
1.0000 | ORAL_TABLET | Freq: Four times a day (QID) | ORAL | 0 refills | Status: DC | PRN
Start: 2019-11-19 — End: 2020-01-27

## 2019-11-19 MED ORDER — SODIUM CHLORIDE 0.9 % IR SOLN
Status: DC | PRN
  Administered 2019-11-19: 3000 mL

## 2019-11-19 MED ORDER — PROMETHAZINE HCL 25 MG/ML IJ SOLN: 6.2500 mg | INTRAMUSCULAR | Status: DC | PRN

## 2019-11-19 MED ORDER — BUPIVACAINE HCL (PF) 0.5 % IJ SOLN
INTRAMUSCULAR | Status: AC
  Filled 2019-11-19: qty 30

## 2019-11-19 MED ORDER — POVIDONE-IODINE 10 % EX SWAB: 2.0000 "application " | Freq: Once | CUTANEOUS | Status: DC

## 2019-11-19 MED ORDER — CHLORHEXIDINE GLUCONATE 0.12 % MT SOLN
15.0000 mL | Freq: Once | OROMUCOSAL | Status: AC
  Administered 2019-11-19: 15 mL via OROMUCOSAL
  Filled 2019-11-19: qty 15

## 2019-11-19 MED ORDER — SUGAMMADEX SODIUM 200 MG/2ML IV SOLN
INTRAVENOUS | Status: DC | PRN
  Administered 2019-11-19: 200 mg via INTRAVENOUS

## 2019-11-19 MED ORDER — FENTANYL CITRATE (PF) 100 MCG/2ML IJ SOLN: 25.0000 ug | INTRAMUSCULAR | Status: DC | PRN

## 2019-11-19 MED ORDER — FENTANYL CITRATE (PF) 250 MCG/5ML IJ SOLN
INTRAMUSCULAR | Status: DC | PRN
  Administered 2019-11-19: 100 ug via INTRAVENOUS

## 2019-11-19 MED ORDER — SUCCINYLCHOLINE CHLORIDE 200 MG/10ML IV SOSY
PREFILLED_SYRINGE | INTRAVENOUS | Status: DC | PRN
  Administered 2019-11-19: 160 mg via INTRAVENOUS

## 2019-11-19 MED ORDER — BUPIVACAINE HCL (PF) 0.5 % IJ SOLN
INTRAMUSCULAR | Status: DC | PRN
  Administered 2019-11-19: 5 mL

## 2019-11-19 MED ORDER — OXYCODONE HCL 5 MG PO TABS: 5.0000 mg | ORAL_TABLET | Freq: Once | ORAL | Status: DC | PRN

## 2019-11-19 MED ORDER — ONDANSETRON HCL 4 MG/2ML IJ SOLN
INTRAMUSCULAR | Status: AC
  Filled 2019-11-19: qty 2

## 2019-11-19 MED ORDER — ROCURONIUM BROMIDE 10 MG/ML (PF) SYRINGE
PREFILLED_SYRINGE | INTRAVENOUS | Status: DC | PRN
  Administered 2019-11-19: 20 mg via INTRAVENOUS

## 2019-11-19 SURGICAL SUPPLY — 14 items
CATH ROBINSON RED A/P 16FR (CATHETERS) ×3 IMPLANT
ELECT REM PT RETURN 9FT ADLT (ELECTROSURGICAL) ×3
ELECTRODE REM PT RTRN 9FT ADLT (ELECTROSURGICAL) ×1 IMPLANT
GLOVE BIO SURGEON STRL SZ 6.5 (GLOVE) ×2 IMPLANT
GLOVE BIO SURGEONS STRL SZ 6.5 (GLOVE) ×1
GLOVE BIOGEL PI IND STRL 7.0 (GLOVE) ×2 IMPLANT
GLOVE BIOGEL PI INDICATOR 7.0 (GLOVE) ×4
GOWN STRL REUS W/ TWL LRG LVL3 (GOWN DISPOSABLE) ×2 IMPLANT
GOWN STRL REUS W/TWL LRG LVL3 (GOWN DISPOSABLE) ×4
KIT PROCEDURE FLUENT (KITS) ×3 IMPLANT
KIT TURNOVER KIT B (KITS) ×3 IMPLANT
PACK VAGINAL MINOR WOMEN LF (CUSTOM PROCEDURE TRAY) ×3 IMPLANT
PAD OB MATERNITY 4.3X12.25 (PERSONAL CARE ITEMS) ×3 IMPLANT
TOWEL GREEN STERILE FF (TOWEL DISPOSABLE) ×6 IMPLANT

## 2019-11-19 NOTE — Transfer of Care (Signed)
Immediate Anesthesia Transfer of Care Note  Patient: Michele Ayers  Procedure(s) Performed: DILATATION AND CURETTAGE /HYSTEROSCOPY (N/A Uterus)  Patient Location: PACU  Anesthesia Type:General  Level of Consciousness: awake, alert  and oriented  Airway & Oxygen Therapy: Patient Spontanous Breathing and Patient connected to face mask oxygen  Post-op Assessment: Report given to RN and Post -op Vital signs reviewed and stable  Post vital signs: Reviewed and stable  Last Vitals:  Vitals Value Taken Time  BP    Temp    Pulse 106 11/19/19 1644  Resp 31 11/19/19 1644  SpO2 93 % 11/19/19 1644  Vitals shown include unvalidated device data.  Last Pain:  Vitals:   11/19/19 1412  TempSrc:   PainSc: 0-No pain      Patients Stated Pain Goal: 5 (81/85/63 1497)  Complications: No complications documented.

## 2019-11-19 NOTE — Op Note (Signed)
PREOPERATIVE DIAGNOSIS:  Abnormal uterine bleeding, postmenopausal POSTOPERATIVE DIAGNOSIS: The same PROCEDURE: Hysteroscopy, Dilation and Curettage. SURGEON:  Dr. Emeterio Reeve   INDICATIONS: 54 y.o. [id number]  here for scheduled surgery for the aforementioned diagnoses.   Risks of surgery were discussed with the patient including but not limited to: bleeding which may require transfusion; infection which may require antibiotics; injury to uterus or surrounding organs; intrauterine scarring which may impair future fertility; need for additional procedures including laparotomy or laparoscopy; and other postoperative/anesthesia complications. Written informed consent was obtained.    FINDINGS:  A 4-6 week size uterus.  Diffuse proliferative endometrium.  Normal ostia bilaterally.  ANESTHESIA:   General, paracervical block with 60 ml of 0.5% Marcaine INTRAVENOUS FLUIDS:  800 ml of LR FLUID DEFICITS:  negligible ESTIMATED BLOOD LOSS:  Less than 20 ml SPECIMENS: Endometrial curettings sent to pathology COMPLICATIONS:  None immediate.  PROCEDURE DETAILS:  The patient was taken to the operating room where general anesthesia was administered and was found to be adequate.  After an adequate timeout was performed, she was placed in the dorsal lithotomy position and examined; then prepped and draped in the sterile manner.   Her bladder was catheterized for an unmeasured amount of clear, yellow urine. A speculum was then placed in the patient's vagina and a single tooth tenaculum was applied to the anterior lip of the cervix.   A paracervical block using 6 ml of 0.5% Marcaine was administered.  The cervix was sounded to 9 cm and dilated manually with metal dilators to accommodate the 5 mm diagnostic hysteroscope.  Once the cervix was dilated, the hysteroscope was inserted under direct visualization using saline as a suspension medium.  The uterine cavity was carefully examined with the findings as noted above.    After further careful visualization of the uterine cavity, the hysteroscope was removed under direct visualization.  A sharp curettage was then performed to obtain a small amount of endometrial curettings.  The tenaculum was removed from the anterior lip of the cervix and the vaginal speculum was removed after noting good hemostasis.  The patient tolerated the procedure well and was taken to the recovery area awake, extubated and in stable condition.  The patient will be discharged to home as per PACU criteria.  Routine postoperative instructions given.  She was prescribed Percocet and Ibuprofen .  She will follow up in the office for postoperative evaluation.  [name], MD 11/19/2019 4:31 PM

## 2019-11-19 NOTE — OR Nursing (Signed)
Pt is awake,alert and oriented. Pt is in NAD at this time. Pt and family verbalized understanding of poc and discharge instructions. instructions given to family and reviewed prior to discharge.Pt is ambulatory to wheelchair with stand by assist but not needed with steady gait.  Pt taken to front lobby and placed in car with family.   

## 2019-11-19 NOTE — Anesthesia Postprocedure Evaluation (Signed)
Anesthesia Post Note  Patient: Michele Ayers  Procedure(s) Performed: DILATATION AND CURETTAGE /HYSTEROSCOPY (N/A Uterus)     Patient location during evaluation: PACU Anesthesia Type: General Level of consciousness: awake and alert Pain management: pain level controlled Vital Signs Assessment: post-procedure vital signs reviewed and stable Respiratory status: spontaneous breathing, nonlabored ventilation and respiratory function stable Cardiovascular status: blood pressure returned to baseline and stable Postop Assessment: no apparent nausea or vomiting Anesthetic complications: no   No complications documented.  Last Vitals:  Vitals:   11/19/19 1700 11/19/19 1715  BP: 133/72 134/70  Pulse: 97 98  Resp: 20 16  Temp:    SpO2: 97% 98%    Last Pain:  Vitals:   11/19/19 1715  TempSrc:   PainSc: 0-No pain                 Audry Pili

## 2019-11-19 NOTE — Discharge Instructions (Signed)
Hysteroscopy, Care After This sheet gives you information about how to care for yourself after your procedure. Your health care provider may also give you more specific instructions. If you have problems or questions, contact your health care provider. What can I expect after the procedure? After the procedure, it is common to have:  Cramping.  Bleeding. This can vary from light spotting to menstrual-like bleeding. Follow these instructions at home: Activity  Rest for 1-2 days after the procedure.  Do not douche, use tampons, or have sex for 2 weeks after the procedure, or until your health care provider approves.  Do not drive for 24 hours after the procedure, or for as long as told by your health care provider.  Do not drive, use heavy machinery, or drink alcohol while taking prescription pain medicines. Medicines   Take over-the-counter and prescription medicines only as told by your health care provider.  Do not take aspirin during recovery. It can increase the risk of bleeding. General instructions  Do not take baths, swim, or use a hot tub until your health care provider approves. Take showers instead of baths for 2 weeks, or for as long as told by your health care provider.  To prevent or treat constipation while you are taking prescription pain medicine, your health care provider may recommend that you: ? Drink enough fluid to keep your urine clear or pale yellow. ? Take over-the-counter or prescription medicines. ? Eat foods that are high in fiber, such as fresh fruits and vegetables, whole grains, and beans. ? Limit foods that are high in fat and processed sugars, such as fried and sweet foods.  Keep all follow-up visits as told by your health care provider. This is important. Contact a health care provider if:  You feel dizzy or lightheaded.  You feel nauseous.  You have abnormal vaginal discharge.  You have a rash.  You have pain that does not get better with  medicine.  You have chills. Get help right away if:  You have bleeding that is heavier than a normal menstrual period.  You have a fever.  You have pain or cramps that get worse.  You develop new abdominal pain.  You faint.  You have pain in your shoulders.  You have shortness of breath. Summary  After the procedure, you may have cramping and some vaginal bleeding.  Do not douche, use tampons, or have sex for 2 weeks after the procedure, or until your health care provider approves.  Do not take baths, swim, or use a hot tub until your health care provider approves. Take showers instead of baths for 2 weeks, or for as long as told by your health care provider.  Report any unusual symptoms to your health care provider.  Keep all follow-up visits as told by your health care provider. This is important. This information is not intended to replace advice given to you by your health care provider. Make sure you discuss any questions you have with your health care provider. Document Revised: 12/02/2016 Document Reviewed: 01/19/2016 Elsevier Patient Education  [place].

## 2019-11-19 NOTE — Anesthesia Preprocedure Evaluation (Addendum)
Anesthesia Evaluation  Patient identified by MRN, date of birth, ID band Patient awake    Reviewed: Allergy & Precautions, NPO status , Patient's Chart, lab work & pertinent test results  History of Anesthesia Complications Negative for: history of anesthetic complications  Airway Mallampati: II  TM Distance: >3 FB Neck ROM: Full    Dental  (+) Dental Advisory Given, Chipped   Pulmonary former smoker,   Possible OSA    Pulmonary exam normal        Cardiovascular negative cardio ROS Normal cardiovascular exam     Neuro/Psych negative neurological ROS  negative psych ROS   GI/Hepatic negative GI ROS, Neg liver ROS,   Endo/Other  Morbid obesity  Renal/GU negative Renal ROS     Musculoskeletal negative musculoskeletal ROS (+)   Abdominal (+) + obese,   Peds  Hematology negative hematology ROS (+)   Anesthesia Other Findings Covid test negative Lymphedema    Reproductive/Obstetrics  s/p tubal ligation                             Anesthesia Physical Anesthesia Plan  ASA: III  Anesthesia Plan: General   Post-op Pain Management:    Induction: Intravenous  PONV Risk Score and Plan: 3 and Treatment may vary due to age or medical condition, Ondansetron and Dexamethasone  Airway Management Planned: Oral ETT  Additional Equipment: None  Intra-op Plan:   Post-operative Plan: Extubation in OR  Informed Consent: I have reviewed the patients History and Physical, chart, labs and discussed the procedure including the risks, benefits and alternatives for the proposed anesthesia with the patient or authorized representative who has indicated his/her understanding and acceptance.     Dental advisory given  Plan Discussed with: CRNA and Anesthesiologist  Anesthesia Plan Comments:        Anesthesia Quick Evaluation

## 2019-11-19 NOTE — Anesthesia Procedure Notes (Signed)
Procedure Name: Intubation Date/Time: 11/19/2019 3:52 PM Performed by: Eligha Bridegroom, CRNA Pre-anesthesia Checklist: Patient identified, Emergency Drugs available, Suction available, Patient being monitored and Timeout performed Patient Re-evaluated:Patient Re-evaluated prior to induction Oxygen Delivery Method: Circle system utilized Preoxygenation: Pre-oxygenation with 100% oxygen Induction Type: IV induction and Rapid sequence Laryngoscope Size: Mac and 4 Grade View: Grade III Tube type: Oral Tube size: 7.0 mm Number of attempts: 1 Airway Equipment and Method: Stylet Placement Confirmation: ETT inserted through vocal cords under direct vision,  positive ETCO2 and breath sounds checked- equal and bilateral Secured at: 22 cm Tube secured with: Tape Dental Injury: Teeth and Oropharynx as per pre-operative assessment

## 2019-11-19 NOTE — H&P (Signed)
Michele Ayers is an 54 y.o. female. [id number] Patient's last menstrual period was 10/09/2019. Patient had postmenopausal bleeding and thickened endometrium on [place]. Endometrial biopsy was attempted but was not diagnostic. She is scheduled for hysteroscopy D&C.  Pertinent Gynecological History:  Menstrual History:  Patient's last menstrual period was 10/09/2019.    Past Medical History:  Diagnosis Date  . Lipoma of back   . Lymph edema     Past Surgical History:  Procedure Laterality Date  . TUBAL LIGATION      Family History  Problem Relation Age of Onset  . COPD Other   . Cancer Other     Social History:  reports that she quit smoking about 4 weeks ago. Her smoking use included cigarettes. She has never used smokeless tobacco. She reports that she does not drink alcohol and does not use drugs.  Allergies: No Known Allergies  Medications Prior to Admission  Medication Sig Dispense Refill Last Dose  . medroxyPROGESTERone (PROVERA) 10 MG tablet Take 2 tablets (20 mg total) by mouth daily. 60 tablet 2 11/19/2019 at Unknown time  . silver sulfADIAZINE (SILVADENE) 1 % cream Apply 1 application topically daily. (Patient taking differently: Apply 1 application topically daily as needed (leg irritation). ) 50 g 2 11/15/2019  . aspirin EC 81 MG tablet Take 81 mg by mouth daily. Swallow whole.   11/15/2019  . diclofenac (VOLTAREN) 75 MG EC tablet Take 1 tablet (75 mg total) by mouth 2 (two) times daily. (Patient not taking: Reported on 11/11/2019) 60 tablet 1   . furosemide (LASIX) 20 MG tablet Take 2 tablets (40 mg total) by mouth daily. 180 tablet 1 11/15/2019    Review of Systems  Constitutional: Negative.   Respiratory: Negative.   Cardiovascular: Negative.   Gastrointestinal: Negative.   Genitourinary: Negative for vaginal bleeding and vaginal discharge.    Blood pressure (!) 132/58, pulse 99, temperature 98.2 F (36.8 C), temperature source Oral, resp. rate 18, height 5\' 5"   (1.651 m), weight (!) 162.4 kg, last menstrual period 10/09/2019, SpO2 97 %. Physical Exam Vitals and nursing note reviewed.  Constitutional:      Appearance: She is obese. She is not ill-appearing.  Pulmonary:     Effort: Pulmonary effort is normal.  Neurological:     Mental Status: She is alert.  Psychiatric:        Mood and Affect: Mood normal.        Behavior: Behavior normal.     Results for orders placed or performed during the hospital encounter of 11/19/19 (from the past 24 hour(s))  Pregnancy, urine POC     Status: None   Collection Time: 11/19/19  2:02 PM  Result Value Ref Range   Preg Test, Ur NEGATIVE NEGATIVE  I-STAT, chem 8     Status: Abnormal   Collection Time: 11/19/19  2:26 PM  Result Value Ref Range   Sodium 136 135 - 145 mmol/L   Potassium 4.4 3.5 - 5.1 mmol/L   Chloride 99 98 - 111 mmol/L   BUN 11 6 - 20 mg/dL   Creatinine, Ser 0.50 0.44 - 1.00 mg/dL   Glucose, Bld 91 70 - 99 mg/dL   Calcium, Ion 1.02 (L) 1.15 - 1.40 mmol/L   TCO2 25 22 - 32 mmol/L   Hemoglobin 13.9 12.0 - 15.0 g/dL   HCT 41.0 36 - 46 %    No results found.  Assessment/Plan: Her endometrial biopsy was not diagnostic as there were only endocervical cells present.  Patient desires surgical management with hysteroscopy and dilation and curettage.  The risks of surgery were discussed in detail with the patient including but not limited to: bleeding which may require transfusion or reoperation; infection which may require prolonged hospitalization or re-hospitalization and antibiotic therapy; injury to bowel, bladder, ureters and major vessels or other surrounding organs; formation of adhesions;need for additional procedures including laparotomy or subsequent procedures secondary to abnormal pathology; thromboembolic phenomenon; incisional problems and other postoperative or anesthesia complications.  Patient was told that the likelihood that her condition and symptoms will be treated  effectively with this surgical management was very high; the postoperative expectations were also discussed in detail. The patient also understands the alternative   Michele Ayers 11/19/2019, 3:41 PM

## 2019-11-20 ENCOUNTER — Encounter (HOSPITAL_COMMUNITY): Payer: Self-pay | Admitting: Obstetrics & Gynecology

## 2019-11-20 ENCOUNTER — Encounter: Payer: Self-pay | Admitting: *Deleted

## 2019-11-21 LAB — SURGICAL PATHOLOGY

## 2019-11-25 ENCOUNTER — Other Ambulatory Visit: Payer: Self-pay | Admitting: Obstetrics & Gynecology

## 2019-11-25 DIAGNOSIS — Z01419 Encounter for gynecological examination (general) (routine) without abnormal findings: Secondary | ICD-10-CM

## 2019-11-25 DIAGNOSIS — N951 Menopausal and female climacteric states: Secondary | ICD-10-CM

## 2019-11-25 DIAGNOSIS — N938 Other specified abnormal uterine and vaginal bleeding: Secondary | ICD-10-CM

## 2019-11-25 NOTE — BH Specialist Note (Addendum)
[facility]al [facility] via Telemedicine Video (Caregility) Visit  11/25/2019 Sarahanne Novakowski [phone]   Pt did not arrive to video visit and did not answer the phone, as "call cannot be completed at this time", so unable to leave voice message; ; left MyChart message for patient.   Erroneous encounter: pt called answering service and cancelled visit.    Caroleen Hamman Collette Pescador

## 2019-12-09 ENCOUNTER — Ambulatory Visit: Payer: No Typology Code available for payment source | Admitting: Clinical

## 2019-12-16 ENCOUNTER — Encounter: Payer: Self-pay | Admitting: Obstetrics & Gynecology

## 2019-12-16 ENCOUNTER — Ambulatory Visit (INDEPENDENT_AMBULATORY_CARE_PROVIDER_SITE_OTHER): Payer: No Typology Code available for payment source | Admitting: Obstetrics & Gynecology

## 2019-12-16 ENCOUNTER — Other Ambulatory Visit: Payer: Self-pay

## 2019-12-16 DIAGNOSIS — N951 Menopausal and female climacteric states: Secondary | ICD-10-CM

## 2019-12-16 DIAGNOSIS — Z01419 Encounter for gynecological examination (general) (routine) without abnormal findings: Secondary | ICD-10-CM

## 2019-12-16 DIAGNOSIS — N938 Other specified abnormal uterine and vaginal bleeding: Secondary | ICD-10-CM

## 2019-12-16 MED ORDER — MEDROXYPROGESTERONE ACETATE 10 MG PO TABS: 10.0000 mg | ORAL_TABLET | Freq: Every day | ORAL | 10 refills | Status: DC

## 2019-12-16 NOTE — Progress Notes (Signed)
States had food issues earlier in year when out of work, but not anymore. Carvin Almas,RN

## 2019-12-16 NOTE — Progress Notes (Signed)
Subjective:     Michele Ayers is a 54 y.o. female who presents to the clinic 3 weeks status post diagnostic hysteroscopy for abnormal uterine bleeding. Eating a regular diet without difficulty. Bowel movements are normal. The patient is not having any pain.  The following portions of the patient's history were reviewed and updated as appropriate: allergies, current medications, past family history, past medical history, past social history, past surgical history and problem list.  Review of Systems Pertinent items are noted in HPI.    Objective:    BP (!) 141/81   Pulse 92   Ht 5\' 5"  (1.651 m)   Wt (!) 363 lb 3.2 oz (164.7 kg)   LMP  (LMP Unknown)   BMI 60.44 kg/m  General:  alert, cooperative and no distress           Assessment:    Doing well postoperatively. Operative findings again reviewed. Pathology report discussed.   Benign endometrium Plan:    1. Continue any current medications. 2. Wound care discussed. 3. Activity restrictions: none 4. Anticipated return to work: now. 5. Follow up: 1 year  6. Continue Provera but decrease to 10 mg a day  [name], MD 12/16/2019

## 2019-12-16 NOTE — Patient Instructions (Signed)

## 2020-01-07 ENCOUNTER — Ambulatory Visit: Payer: No Typology Code available for payment source | Admitting: Emergency Medicine

## 2020-01-08 ENCOUNTER — Encounter: Payer: Self-pay | Admitting: Emergency Medicine

## 2020-01-08 ENCOUNTER — Other Ambulatory Visit: Payer: Self-pay

## 2020-01-08 ENCOUNTER — Ambulatory Visit (INDEPENDENT_AMBULATORY_CARE_PROVIDER_SITE_OTHER): Payer: No Typology Code available for payment source | Admitting: Emergency Medicine

## 2020-01-08 VITALS — BP 128/84 | HR 95 | Temp 98.1°F | Resp 16 | Ht 65.0 in | Wt 362.0 lb

## 2020-01-08 DIAGNOSIS — Z6841 Body Mass Index (BMI) 40.0 and over, adult: Secondary | ICD-10-CM | POA: Diagnosis not present

## 2020-01-08 DIAGNOSIS — M545 Low back pain, unspecified: Secondary | ICD-10-CM | POA: Diagnosis not present

## 2020-01-08 DIAGNOSIS — E785 Hyperlipidemia, unspecified: Secondary | ICD-10-CM

## 2020-01-08 DIAGNOSIS — I890 Lymphedema, not elsewhere classified: Secondary | ICD-10-CM

## 2020-01-08 DIAGNOSIS — R7303 Prediabetes: Secondary | ICD-10-CM

## 2020-01-08 DIAGNOSIS — G8929 Other chronic pain: Secondary | ICD-10-CM

## 2020-01-08 MED ORDER — ROSUVASTATIN CALCIUM 10 MG PO TABS: 10.0000 mg | ORAL_TABLET | Freq: Every day | ORAL | 3 refills | Status: DC

## 2020-01-08 MED ORDER — METFORMIN HCL 1000 MG PO TABS: 1000.0000 mg | ORAL_TABLET | Freq: Two times a day (BID) | ORAL | 3 refills | Status: DC

## 2020-01-08 NOTE — Progress Notes (Signed)
Michele Ayers 55 y.o.   Chief Complaint  Patient presents with  . Vaginal Bleeding    Follow up 6 month, per patient she had a D & C on November 19 2019.    HISTORY OF PRESENT ILLNESS: This is a 55 y.o. female here for follow-up of chronic medical problems: 1.  Chronic lymphedema 2.  Morbid obesity 3.  Prediabetes 4.  Dyslipidemia 5.  Dysfunctional uterine bleeding status post D&C last November.  Doing well. Mostly concerned about chronic lymphedema. Fully vaccinated against Covid.  HPI   Prior to Admission medications   Medication Sig Start Date End Date Taking? Authorizing Provider  aspirin EC 81 MG tablet Take 81 mg by mouth daily. Swallow whole.   Yes [provider]  ibuprofen (ADVIL) 600 MG tablet Take 1 tablet (600 mg total) by mouth every 6 (six) hours as needed. 11/19/19  Yes [name], MD  medroxyPROGESTERone (PROVERA) 10 MG tablet Take 1 tablet (10 mg total) by mouth daily. 12/16/19  Yes [name], MD  oxyCODONE-acetaminophen (PERCOCET/ROXICET) 5-325 MG tablet Take 1-2 tablets by mouth every 6 (six) hours as needed. 11/19/19  Yes [name], MD  silver sulfADIAZINE (SILVADENE) 1 % cream Apply 1 application topically daily. Patient taking differently: Apply 1 application topically daily as needed (leg irritation). 09/30/19  Yes Abelino Tippin, [name], MD  furosemide (LASIX) 20 MG tablet Take 2 tablets (40 mg total) by mouth daily. 09/30/19 12/29/19  Horald Pollen, MD    No Known Allergies  Patient Active Problem List   Diagnosis Date Noted  . Body mass index (BMI) of 60.0-69.9 in adult ([place]) 07/15/2019  . Abnormal vaginal bleeding 07/15/2019  . Perimenopause 07/15/2019  . Lymphedema of both lower extremities 01/03/2019  . Morbid obesity with BMI of 50.0-59.9, adult ([place]) 01/03/2019    Past Medical History:  Diagnosis Date  . Lipoma of back   . Lymph edema     Past Surgical History:  Procedure Laterality Date  .  HYSTEROSCOPY WITH D & C N/A 11/19/2019   Procedure: DILATATION AND CURETTAGE /HYSTEROSCOPY;  Surgeon: [name], MD;  Location: [facility];  Service: Gynecology;  Laterality: N/A;  . TUBAL LIGATION      Social History   Socioeconomic History  . Marital status: Single    Spouse name: Not on file  . Number of children: Not on file  . Years of education: Not on file  . Highest education level: Not on file  Occupational History  . Not on file  Tobacco Use  . Smoking status: Former Smoker    Types: Cigarettes    Quit date: 10/18/2019    Years since quitting: 0.2  . Smokeless tobacco: Never Used  . Tobacco comment: per pt stopped a month ago only smoked a pack in  3 days  Vaping Use  . Vaping Use: Never used  Substance and Sexual Activity  . Alcohol use: No  . Drug use: No  . Sexual activity: Not Currently    Birth control/protection: Surgical  Other Topics Concern  . Not on file  Social History Narrative  . Not on file   Social Determinants of Health   Financial Resource Strain: Not on file  Food Insecurity: Food Insecurity Present  . Worried About Charity fundraiser in the Last Year: Never true  . Ran Out of Food in the Last Year: Sometimes true  Transportation Needs: No Transportation Needs  . Lack of Transportation (Medical): No  .  Lack of Transportation (Non-Medical): No  Physical Activity: Not on file  Stress: Not on file  Social Connections: Not on file  Intimate Partner Violence: Not on file    Family History  Problem Relation Age of Onset  . COPD Other   . Cancer Other      Review of Systems  Constitutional: Negative.  Negative for chills and fever.  HENT: Negative.  Negative for congestion and sore throat.   Respiratory: Negative.  Negative for cough and shortness of breath.   Cardiovascular: Positive for leg swelling (Chronic lymphedema). Negative for chest pain and palpitations.  Gastrointestinal: Negative.  Negative for abdominal pain, diarrhea,  nausea and vomiting.  Genitourinary: Negative.  Negative for dysuria and hematuria.  Musculoskeletal: Negative.  Negative for myalgias.  Skin: Negative.  Negative for rash.  Neurological: Negative.  Negative for dizziness and headaches.  All other systems reviewed and are negative.    Today's Vitals   01/08/20 1009  BP: 128/84  Pulse: 95  Resp: 16  Temp: 98.1 F (36.7 C)  TempSrc: Temporal  SpO2: 97%  Weight: (!) 362 lb (164.2 kg)  Height: 5\' 5"  (1.651 m)   Body mass index is 60.24 kg/m. Wt Readings from Last 3 Encounters:  01/08/20 (!) 362 lb (164.2 kg)  12/16/19 (!) 363 lb 3.2 oz (164.7 kg)  11/19/19 (!) 358 lb (162.4 kg)     Physical Exam Vitals reviewed.  Constitutional:      Appearance: She is obese.  HENT:     Head: Normocephalic.  Eyes:     Extraocular Movements: Extraocular movements intact.  Cardiovascular:     Rate and Rhythm: Normal rate.  Pulmonary:     Effort: Pulmonary effort is normal.  Musculoskeletal:     Cervical back: Normal range of motion.     Right lower leg: Edema present.     Left lower leg: Edema present.  Skin:    General: Skin is warm and dry.     Capillary Refill: Capillary refill takes less than 2 seconds.  Neurological:     General: No focal deficit present.     Mental Status: She is alert and oriented to person, place, and time.  Psychiatric:        Mood and Affect: Mood normal.        Behavior: Behavior normal.      ASSESSMENT & PLAN: [place] was seen today for vaginal bleeding.  Diagnoses and all orders for this visit:  Lymphedema of both lower extremities  Body mass index (BMI) of 60.0-69.9 in adult [facility])  Chronic bilateral low back pain, unspecified whether sciatica present  Morbid obesity ([place]) -     Amb Ref to Medical Weight Management  Prediabetes -     metFORMIN (GLUCOPHAGE) 1000 MG tablet; Take 1 tablet (1,000 mg total) by mouth 2 (two) times daily with a meal.  Dyslipidemia -     rosuvastatin (CRESTOR)  10 MG tablet; Take 1 tablet (10 mg total) by mouth daily.    Patient Instructions       If you have lab work done today you will be contacted with your lab results within the next 2 weeks.  If you have not heard from [place] then please contact us. The fastest way to get your results is to register for My Chart.   IF you received an x-ray today, you will receive an invoice from [facility]. Please contact [facility] at [phone] with questions or concerns regarding your invoice.  IF you received labwork today, you will receive an invoice from [facility]. Please contact [facility] at [phone] with questions or concerns regarding your invoice.   Our billing staff will not be able to assist you with questions regarding bills from these companies.  You will be contacted with the lab results as soon as they are available. The fastest way to get your results is to activate your My Chart account. Instructions are located on the last page of this paperwork. If you have not heard from [place] regarding the results in 2 weeks, please contact this office.     Health Maintenance, Female Adopting a healthy lifestyle and getting preventive care are important in promoting health and wellness. Ask your health care provider about:  The right schedule for you to have regular tests and exams.  Things you can do on your own to prevent diseases and keep yourself healthy. What should I know about diet, weight, and exercise? Eat a healthy diet   Eat a diet that includes plenty of vegetables, fruits, low-fat dairy products, and lean protein.  Do not eat a lot of foods that are high in solid fats, added sugars, or sodium. Maintain a healthy weight Body mass index (BMI) is used to identify weight problems. It estimates body fat based on height and weight. Your health care provider can help determine your BMI and help you achieve or maintain a healthy weight. Get regular exercise Get regular  exercise. This is one of the most important things you can do for your health. Most adults should:  Exercise for at least 150 minutes each week. The exercise should increase your heart rate and make you sweat (moderate-intensity exercise).  Do strengthening exercises at least twice a week. This is in addition to the moderate-intensity exercise.  Spend less time sitting. Even light physical activity can be beneficial. Watch cholesterol and blood lipids Have your blood tested for lipids and cholesterol at 55 years of age, then have this test every 5 years. Have your cholesterol levels checked more often if:  Your lipid or cholesterol levels are high.  You are older than 53 years of age.  You are at high risk for heart disease. What should I know about cancer screening? Depending on your health history and family history, you may need to have cancer screening at various ages. This may include screening for:  Breast cancer.  Cervical cancer.  Colorectal cancer.  Skin cancer.  Lung cancer. What should I know about heart disease, diabetes, and high blood pressure? Blood pressure and heart disease  High blood pressure causes heart disease and increases the risk of stroke. This is more likely to develop in people who have high blood pressure readings, are of African descent, or are overweight.  Have your blood pressure checked: ? Every 3-5 years if you are 3-54 years of age. ? Every year if you are 6 years old or older. Diabetes Have regular diabetes screenings. This checks your fasting blood sugar level. Have the screening done:  Once every three years after age 25 if you are at a normal weight and have a low risk for diabetes.  More often and at a younger age if you are overweight or have a high risk for diabetes. What should I know about preventing infection? Hepatitis B If you have a higher risk for hepatitis B, you should be screened for this virus. Talk with your health  care provider to find out if you are at risk for hepatitis  B infection. Hepatitis C Testing is recommended for:  Everyone born from [birth year] through 1963.  Anyone with known risk factors for hepatitis C. Sexually transmitted infections (STIs)  Get screened for STIs, including gonorrhea and chlamydia, if: ? You are sexually active and are younger than 53 years of age. ? You are older than 53 years of age and your health care provider tells you that you are at risk for this type of infection. ? Your sexual activity has changed since you were last screened, and you are at increased risk for chlamydia or gonorrhea. Ask your health care provider if you are at risk.  Ask your health care provider about whether you are at high risk for HIV. Your health care provider may recommend a prescription medicine to help prevent HIV infection. If you choose to take medicine to prevent HIV, you should first get tested for HIV. You should then be tested every 3 months for as long as you are taking the medicine. Pregnancy  If you are about to stop having your period (premenopausal) and you may become pregnant, seek counseling before you get pregnant.  Take 400 to 800 micrograms (mcg) of folic acid every day if you become pregnant.  Ask for birth control (contraception) if you want to prevent pregnancy. Osteoporosis and menopause Osteoporosis is a disease in which the bones lose minerals and strength with aging. This can result in bone fractures. If you are 71 years old or older, or if you are at risk for osteoporosis and fractures, ask your health care provider if you should:  Be screened for bone loss.  Take a calcium or vitamin D supplement to lower your risk of fractures.  Be given hormone replacement therapy (HRT) to treat symptoms of menopause. Follow these instructions at home: Lifestyle  Do not use any products that contain nicotine or tobacco, such as cigarettes, e-cigarettes, and chewing tobacco.  If you need help quitting, ask your health care provider.  Do not use street drugs.  Do not share needles.  Ask your health care provider for help if you need support or information about quitting drugs. Alcohol use  Do not drink alcohol if: ? Your health care provider tells you not to drink. ? You are pregnant, may be pregnant, or are planning to become pregnant.  If you drink alcohol: ? Limit how much you use to 0-1 drink a day. ? Limit intake if you are breastfeeding.  Be aware of how much alcohol is in your drink. In the [place] one drink equals one 12 oz bottle of beer (355 mL), one 5 oz glass of wine (148 mL), or one 1 oz glass of hard liquor (44 mL). General instructions  Schedule regular health, dental, and eye exams.  Stay current with your vaccines.  Tell your health care provider if: ? You often feel depressed. ? You have ever been abused or do not feel safe at home. Summary  Adopting a healthy lifestyle and getting preventive care are important in promoting health and wellness.  Follow your health care provider's instructions about healthy diet, exercising, and getting tested or screened for diseases.  Follow your health care provider's instructions on monitoring your cholesterol and blood pressure. This information is not intended to replace advice given to you by your health care provider. Make sure you discuss any questions you have with your health care provider. Document Revised: 12/13/2017 Document Reviewed: 12/13/2017 Elsevier Patient Education  2020 [place] for [employer]  Calories are units of energy. Your body needs a certain amount of calories from food to keep you going throughout the day. When you eat more calories than your body needs, your body stores the extra calories as fat. When you eat fewer calories than your body needs, your body burns fat to get the energy it needs. Calorie counting means keeping track of how many  calories you eat and drink each day. Calorie counting can be helpful if you need to lose weight. If you make sure to eat fewer calories than your body needs, you should lose weight. Ask your health care provider what a healthy weight is for you. For calorie counting to work, you will need to eat the right number of calories in a day in order to lose a healthy amount of weight per week. A dietitian can help you determine how many calories you need in a day and will give you suggestions on how to reach your calorie goal.  A healthy amount of weight to lose per week is usually 1-2 lb (0.5-0.9 kg). This usually means that your daily calorie intake should be reduced by 500-750 calories.  Eating 1,200 - 1,500 calories per day can help most women lose weight.  Eating 1,500 - 1,800 calories per day can help most men lose weight. What is my plan? My goal is to have __________ calories per day. If I have this many calories per day, I should lose around __________ pounds per week. What do I need to know about calorie counting? In order to meet your daily calorie goal, you will need to:  Find out how many calories are in each food you would like to eat. Try to do this before you eat.  Decide how much of the food you plan to eat.  Write down what you ate and how many calories it had. Doing this is called keeping a food log. To successfully lose weight, it is important to balance calorie counting with a healthy lifestyle that includes regular activity. Aim for 150 minutes of moderate exercise (such as walking) or 75 minutes of vigorous exercise (such as running) each week. Where do I find calorie information?  The number of calories in a food can be found on a Nutrition Facts label. If a food does not have a Nutrition Facts label, try to look up the calories online or ask your dietitian for help. Remember that calories are listed per serving. If you choose to have more than one serving of a food, you will  have to multiply the calories per serving by the amount of servings you plan to eat. For example, the label on a package of bread might say that a serving size is 1 slice and that there are 90 calories in a serving. If you eat 1 slice, you will have eaten 90 calories. If you eat 2 slices, you will have eaten 180 calories. How do I keep a food log? Immediately after each meal, record the following information in your food log:  What you ate. Don't forget to include toppings, sauces, and other extras on the food.  How much you ate. This can be measured in cups, ounces, or number of items.  How many calories each food and drink had.  The total number of calories in the meal. Keep your food log near you, such as in a small notebook in your pocket, or use a mobile app or website. Some programs will calculate calories for you and  show you how many calories you have left for the day to meet your goal. What are some calorie counting tips?   Use your calories on foods and drinks that will fill you up and not leave you hungry: ? Some examples of foods that fill you up are nuts and nut butters, vegetables, lean proteins, and high-fiber foods like whole grains. High-fiber foods are foods with more than 5 g fiber per serving. ? Drinks such as sodas, specialty coffee drinks, alcohol, and juices have a lot of calories, yet do not fill you up.  Eat nutritious foods and avoid empty calories. Empty calories are calories you get from foods or beverages that do not have many vitamins or protein, such as candy, sweets, and soda. It is better to have a nutritious high-calorie food (such as an avocado) than a food with few nutrients (such as a bag of chips).  Know how many calories are in the foods you eat most often. This will help you calculate calorie counts faster.  Pay attention to calories in drinks. Low-calorie drinks include water and unsweetened drinks.  Pay attention to nutrition labels for "low fat" or  "fat free" foods. These foods sometimes have the same amount of calories or more calories than the full fat versions. They also often have added sugar, starch, or salt, to make up for flavor that was removed with the fat.  Find a way of tracking calories that works for you. Get creative. Try different apps or programs if writing down calories does not work for you. What are some portion control tips?  Know how many calories are in a serving. This will help you know how many servings of a certain food you can have.  Use a measuring cup to measure serving sizes. You could also try weighing out portions on a kitchen scale. With time, you will be able to estimate serving sizes for some foods.  Take some time to put servings of different foods on your favorite plates, bowls, and cups so you know what a serving looks like.  Try not to eat straight from a bag or box. Doing this can lead to overeating. Put the amount you would like to eat in a cup or on a plate to make sure you are eating the right portion.  Use smaller plates, glasses, and bowls to prevent overeating.  Try not to multitask (for example, watch TV or use your computer) while eating. If it is time to eat, sit down at a table and enjoy your food. This will help you to know when you are full. It will also help you to be aware of what you are eating and how much you are eating. What are tips for following this plan? Reading food labels  Check the calorie count compared to the serving size. The serving size may be smaller than what you are used to eating.  Check the source of the calories. Make sure the food you are eating is high in vitamins and protein and low in saturated and trans fats. Shopping  Read nutrition labels while you shop. This will help you make healthy decisions before you decide to purchase your food.  Make a grocery list and stick to it. Cooking  Try to cook your favorite foods in a healthier way. For example, try  baking instead of frying.  Use low-fat dairy products. Meal planning  Use more fruits and vegetables. Half of your plate should be fruits and vegetables.  Include  lean proteins like poultry and fish. How do I count calories when eating out?  Ask for smaller portion sizes.  Consider sharing an entree and sides instead of getting your own entree.  If you get your own entree, eat only half. Ask for a box at the beginning of your meal and put the rest of your entree in it so you are not tempted to eat it.  If calories are listed on the menu, choose the lower calorie options.  Choose dishes that include vegetables, fruits, whole grains, low-fat dairy products, and lean protein.  Choose items that are boiled, broiled, grilled, or steamed. Stay away from items that are buttered, battered, fried, or served with cream sauce. Items labeled "crispy" are usually fried, unless stated otherwise.  Choose water, low-fat milk, unsweetened iced tea, or other drinks without added sugar. If you want an alcoholic beverage, choose a lower calorie option such as a glass of wine or light beer.  Ask for dressings, sauces, and syrups on the side. These are usually high in calories, so you should limit the amount you eat.  If you want a salad, choose a garden salad and ask for grilled meats. Avoid extra toppings like bacon, cheese, or fried items. Ask for the dressing on the side, or ask for olive oil and vinegar or lemon to use as dressing.  Estimate how many servings of a food you are given. For example, a serving of cooked rice is  cup or about the size of half a baseball. Knowing serving sizes will help you be aware of how much food you are eating at restaurants. The list below tells you how big or small some common portion sizes are based on everyday objects: ? 1 oz--4 stacked dice. ? 3 oz--1 deck of cards. ? 1 tsp--1 die. ? 1 Tbsp-- a ping-pong ball. ? 2 Tbsp--1 ping-pong ball. ?  cup--  baseball. ? 1 cup--1 baseball. Summary  Calorie counting means keeping track of how many calories you eat and drink each day. If you eat fewer calories than your body needs, you should lose weight.  A healthy amount of weight to lose per week is usually 1-2 lb (0.5-0.9 kg). This usually means reducing your daily calorie intake by 500-750 calories.  The number of calories in a food can be found on a Nutrition Facts label. If a food does not have a Nutrition Facts label, try to look up the calories online or ask your dietitian for help.  Use your calories on foods and drinks that will fill you up, and not on foods and drinks that will leave you hungry.  Use smaller plates, glasses, and bowls to prevent overeating. This information is not intended to replace advice given to you by your health care provider. Make sure you discuss any questions you have with your health care provider. Document Revised: 09/08/2017 Document Reviewed: 11/20/2015 Elsevier Patient Education  2020 [employer] Inc[employer]      Edwina Barth, MD [facility]

## 2020-01-08 NOTE — Patient Instructions (Addendum)
   If you have lab work done today you will be contacted with your lab results within the next 2 weeks.  If you have not heard from us then please contact us. The fastest way to get your results is to register for My Chart.   IF you received an x-ray today, you will receive an invoice from [facility]. Please contact [facility] at [phone] with questions or concerns regarding your invoice.   IF you received labwork today, you will receive an invoice from [facility]. Please contact [facility] at [phone] with questions or concerns regarding your invoice.   Our billing staff will not be able to assist you with questions regarding bills from these companies.  You will be contacted with the lab results as soon as they are available. The fastest way to get your results is to activate your My Chart account. Instructions are located on the last page of this paperwork. If you have not heard from us regarding the results in 2 weeks, please contact this office.      Health Maintenance, Female Adopting a healthy lifestyle and getting preventive care are important in promoting health and wellness. Ask your health care provider about:  The right schedule for you to have regular tests and exams.  Things you can do on your own to prevent diseases and keep yourself healthy. What should I know about diet, weight, and exercise? Eat a healthy diet   Eat a diet that includes plenty of vegetables, fruits, low-fat dairy products, and lean protein.  Do not eat a lot of foods that are high in solid fats, added sugars, or sodium. Maintain a healthy weight Body mass index (BMI) is used to identify weight problems. It estimates body fat based on height and weight. Your health care provider can help determine your BMI and help you achieve or maintain a healthy weight. Get regular exercise Get regular exercise. This is one of the most important things you can do for your health. Most  adults should:  Exercise for at least 150 minutes each week. The exercise should increase your heart rate and make you sweat (moderate-intensity exercise).  Do strengthening exercises at least twice a week. This is in addition to the moderate-intensity exercise.  Spend less time sitting. Even light physical activity can be beneficial. Watch cholesterol and blood lipids Have your blood tested for lipids and cholesterol at 55 years of age, then have this test every 5 years. Have your cholesterol levels checked more often if:  Your lipid or cholesterol levels are high.  You are older than 53 years of age.  You are at high risk for heart disease. What should I know about cancer screening? Depending on your health history and family history, you may need to have cancer screening at various ages. This may include screening for:  Breast cancer.  Cervical cancer.  Colorectal cancer.  Skin cancer.  Lung cancer. What should I know about heart disease, diabetes, and high blood pressure? Blood pressure and heart disease  High blood pressure causes heart disease and increases the risk of stroke. This is more likely to develop in people who have high blood pressure readings, are of African descent, or are overweight.  Have your blood pressure checked: ? Every 3-5 years if you are 18-39 years of age. ? Every year if you are 40 years old or older. Diabetes Have regular diabetes screenings. This checks your fasting blood sugar level. Have the screening done:  Once every   three years after age 40 if you are at a normal weight and have a low risk for diabetes.  More often and at a younger age if you are overweight or have a high risk for diabetes. What should I know about preventing infection? Hepatitis B If you have a higher risk for hepatitis B, you should be screened for this virus. Talk with your health care provider to find out if you are at risk for hepatitis B infection. Hepatitis  C Testing is recommended for:  Everyone born from 1945 through 1965.  Anyone with known risk factors for hepatitis C. Sexually transmitted infections (STIs)  Get screened for STIs, including gonorrhea and chlamydia, if: ? You are sexually active and are younger than 53 years of age. ? You are older than 53 years of age and your health care provider tells you that you are at risk for this type of infection. ? Your sexual activity has changed since you were last screened, and you are at increased risk for chlamydia or gonorrhea. Ask your health care provider if you are at risk.  Ask your health care provider about whether you are at high risk for HIV. Your health care provider may recommend a prescription medicine to help prevent HIV infection. If you choose to take medicine to prevent HIV, you should first get tested for HIV. You should then be tested every 3 months for as long as you are taking the medicine. Pregnancy  If you are about to stop having your period (premenopausal) and you may become pregnant, seek counseling before you get pregnant.  Take 400 to 800 micrograms (mcg) of folic acid every day if you become pregnant.  Ask for birth control (contraception) if you want to prevent pregnancy. Osteoporosis and menopause Osteoporosis is a disease in which the bones lose minerals and strength with aging. This can result in bone fractures. If you are 65 years old or older, or if you are at risk for osteoporosis and fractures, ask your health care provider if you should:  Be screened for bone loss.  Take a calcium or vitamin D supplement to lower your risk of fractures.  Be given hormone replacement therapy (HRT) to treat symptoms of menopause. Follow these instructions at home: Lifestyle  Do not use any products that contain nicotine or tobacco, such as cigarettes, e-cigarettes, and chewing tobacco. If you need help quitting, ask your health care provider.  Do not use street  drugs.  Do not share needles.  Ask your health care provider for help if you need support or information about quitting drugs. Alcohol use  Do not drink alcohol if: ? Your health care provider tells you not to drink. ? You are pregnant, may be pregnant, or are planning to become pregnant.  If you drink alcohol: ? Limit how much you use to 0-1 drink a day. ? Limit intake if you are breastfeeding.  Be aware of how much alcohol is in your drink. In the [place] one drink equals one 12 oz bottle of beer (355 mL), one 5 oz glass of wine (148 mL), or one 1 oz glass of hard liquor (44 mL). General instructions  Schedule regular health, dental, and eye exams.  Stay current with your vaccines.  Tell your health care provider if: ? You often feel depressed. ? You have ever been abused or do not feel safe at home. Summary  Adopting a healthy lifestyle and getting preventive care are important in promoting health and wellness.    Follow your health care provider's instructions about healthy diet, exercising, and getting tested or screened for diseases.  Follow your health care provider's instructions on monitoring your cholesterol and blood pressure. This information is not intended to replace advice given to you by your health care provider. Make sure you discuss any questions you have with your health care provider. Document Revised: 12/13/2017 Document Reviewed: 12/13/2017 Elsevier Patient Education  2020 [place] [employer] Calories are units of energy. Your body needs a certain amount of calories from food to keep you going throughout the day. When you eat more calories than your body needs, your body stores the extra calories as fat. When you eat fewer calories than your body needs, your body burns fat to get the energy it needs. Calorie counting means keeping track of how many calories you eat and drink each day. Calorie counting can be helpful if you need to  lose weight. If you make sure to eat fewer calories than your body needs, you should lose weight. Ask your health care provider what a healthy weight is for you. For calorie counting to work, you will need to eat the right number of calories in a day in order to lose a healthy amount of weight per week. A dietitian can help you determine how many calories you need in a day and will give you suggestions on how to reach your calorie goal.  A healthy amount of weight to lose per week is usually 1-2 lb (0.5-0.9 kg). This usually means that your daily calorie intake should be reduced by 500-750 calories.  Eating 1,200 - 1,500 calories per day can help most women lose weight.  Eating 1,500 - 1,800 calories per day can help most men lose weight. What is my plan? My goal is to have __________ calories per day. If I have this many calories per day, I should lose around __________ pounds per week. What do I need to know about calorie counting? In order to meet your daily calorie goal, you will need to:  Find out how many calories are in each food you would like to eat. Try to do this before you eat.  Decide how much of the food you plan to eat.  Write down what you ate and how many calories it had. Doing this is called keeping a food log. To successfully lose weight, it is important to balance calorie counting with a healthy lifestyle that includes regular activity. Aim for 150 minutes of moderate exercise (such as walking) or 75 minutes of vigorous exercise (such as running) each week. Where do I find calorie information?  The number of calories in a food can be found on a Nutrition Facts label. If a food does not have a Nutrition Facts label, try to look up the calories online or ask your dietitian for help. Remember that calories are listed per serving. If you choose to have more than one serving of a food, you will have to multiply the calories per serving by the amount of servings you plan to eat.  For example, the label on a package of bread might say that a serving size is 1 slice and that there are 90 calories in a serving. If you eat 1 slice, you will have eaten 90 calories. If you eat 2 slices, you will have eaten 180 calories. How do I keep a food log? Immediately after each meal, record the following information in your food log:  What you ate. Don't forget to include toppings, sauces, and other extras on the food.  How much you ate. This can be measured in cups, ounces, or number of items.  How many calories each food and drink had.  The total number of calories in the meal. Keep your food log near you, such as in a small notebook in your pocket, or use a mobile app or website. Some programs will calculate calories for you and show you how many calories you have left for the day to meet your goal. What are some calorie counting tips?   Use your calories on foods and drinks that will fill you up and not leave you hungry: ? Some examples of foods that fill you up are nuts and nut butters, vegetables, lean proteins, and high-fiber foods like whole grains. High-fiber foods are foods with more than 5 g fiber per serving. ? Drinks such as sodas, specialty coffee drinks, alcohol, and juices have a lot of calories, yet do not fill you up.  Eat nutritious foods and avoid empty calories. Empty calories are calories you get from foods or beverages that do not have many vitamins or protein, such as candy, sweets, and soda. It is better to have a nutritious high-calorie food (such as an avocado) than a food with few nutrients (such as a bag of chips).  Know how many calories are in the foods you eat most often. This will help you calculate calorie counts faster.  Pay attention to calories in drinks. Low-calorie drinks include water and unsweetened drinks.  Pay attention to nutrition labels for "low fat" or "fat free" foods. These foods sometimes have the same amount of calories or more  calories than the full fat versions. They also often have added sugar, starch, or salt, to make up for flavor that was removed with the fat.  Find a way of tracking calories that works for you. Get creative. Try different apps or programs if writing down calories does not work for you. What are some portion control tips?  Know how many calories are in a serving. This will help you know how many servings of a certain food you can have.  Use a measuring cup to measure serving sizes. You could also try weighing out portions on a kitchen scale. With time, you will be able to estimate serving sizes for some foods.  Take some time to put servings of different foods on your favorite plates, bowls, and cups so you know what a serving looks like.  Try not to eat straight from a bag or box. Doing this can lead to overeating. Put the amount you would like to eat in a cup or on a plate to make sure you are eating the right portion.  Use smaller plates, glasses, and bowls to prevent overeating.  Try not to multitask (for example, watch TV or use your computer) while eating. If it is time to eat, sit down at a table and enjoy your food. This will help you to know when you are full. It will also help you to be aware of what you are eating and how much you are eating. What are tips for following this plan? Reading food labels  Check the calorie count compared to the serving size. The serving size may be smaller than what you are used to eating.  Check the source of the calories. Make sure the food you are eating is high in vitamins and protein and low in  saturated and trans fats. Shopping  Read nutrition labels while you shop. This will help you make healthy decisions before you decide to purchase your food.  Make a grocery list and stick to it. Cooking  Try to cook your favorite foods in a healthier way. For example, try baking instead of frying.  Use low-fat dairy products. Meal planning  Use  more fruits and vegetables. Half of your plate should be fruits and vegetables.  Include lean proteins like poultry and fish. How do I count calories when eating out?  Ask for smaller portion sizes.  Consider sharing an entree and sides instead of getting your own entree.  If you get your own entree, eat only half. Ask for a box at the beginning of your meal and put the rest of your entree in it so you are not tempted to eat it.  If calories are listed on the menu, choose the lower calorie options.  Choose dishes that include vegetables, fruits, whole grains, low-fat dairy products, and lean protein.  Choose items that are boiled, broiled, grilled, or steamed. Stay away from items that are buttered, battered, fried, or served with cream sauce. Items labeled "crispy" are usually fried, unless stated otherwise.  Choose water, low-fat milk, unsweetened iced tea, or other drinks without added sugar. If you want an alcoholic beverage, choose a lower calorie option such as a glass of wine or light beer.  Ask for dressings, sauces, and syrups on the side. These are usually high in calories, so you should limit the amount you eat.  If you want a salad, choose a garden salad and ask for grilled meats. Avoid extra toppings like bacon, cheese, or fried items. Ask for the dressing on the side, or ask for olive oil and vinegar or lemon to use as dressing.  Estimate how many servings of a food you are given. For example, a serving of cooked rice is  cup or about the size of half a baseball. Knowing serving sizes will help you be aware of how much food you are eating at restaurants. The list below tells you how big or small some common portion sizes are based on everyday objects: ? 1 oz--4 stacked dice. ? 3 oz--1 deck of cards. ? 1 tsp--1 die. ? 1 Tbsp-- a ping-pong ball. ? 2 Tbsp--1 ping-pong ball. ?  cup-- baseball. ? 1 cup--1 baseball. Summary  Calorie counting means keeping track of how  many calories you eat and drink each day. If you eat fewer calories than your body needs, you should lose weight.  A healthy amount of weight to lose per week is usually 1-2 lb (0.5-0.9 kg). This usually means reducing your daily calorie intake by 500-750 calories.  The number of calories in a food can be found on a Nutrition Facts label. If a food does not have a Nutrition Facts label, try to look up the calories online or ask your dietitian for help.  Use your calories on foods and drinks that will fill you up, and not on foods and drinks that will leave you hungry.  Use smaller plates, glasses, and bowls to prevent overeating. This information is not intended to replace advice given to you by your health care provider. Make sure you discuss any questions you have with your health care provider. Document Revised: 09/08/2017 Document Reviewed: 11/20/2015 Elsevier Patient Education  2020 [employer].

## 2020-01-09 ENCOUNTER — Other Ambulatory Visit: Payer: Self-pay | Admitting: Emergency Medicine

## 2020-01-09 DIAGNOSIS — M461 Sacroiliitis, not elsewhere classified: Secondary | ICD-10-CM

## 2020-01-15 ENCOUNTER — Encounter (INDEPENDENT_AMBULATORY_CARE_PROVIDER_SITE_OTHER): Payer: Self-pay

## 2020-01-21 ENCOUNTER — Encounter (INDEPENDENT_AMBULATORY_CARE_PROVIDER_SITE_OTHER): Payer: Self-pay

## 2020-01-27 ENCOUNTER — Ambulatory Visit (INDEPENDENT_AMBULATORY_CARE_PROVIDER_SITE_OTHER): Payer: No Typology Code available for payment source | Admitting: Bariatrics

## 2020-01-27 ENCOUNTER — Encounter (INDEPENDENT_AMBULATORY_CARE_PROVIDER_SITE_OTHER): Payer: Self-pay | Admitting: Bariatrics

## 2020-01-27 ENCOUNTER — Other Ambulatory Visit: Payer: Self-pay

## 2020-01-27 VITALS — BP 113/69 | HR 87 | Temp 98.1°F | Ht 62.0 in | Wt 359.0 lb

## 2020-01-27 DIAGNOSIS — R0602 Shortness of breath: Secondary | ICD-10-CM | POA: Diagnosis not present

## 2020-01-27 DIAGNOSIS — R7303 Prediabetes: Secondary | ICD-10-CM | POA: Diagnosis not present

## 2020-01-27 DIAGNOSIS — Z9189 Other specified personal risk factors, not elsewhere classified: Secondary | ICD-10-CM

## 2020-01-27 DIAGNOSIS — Z6841 Body Mass Index (BMI) 40.0 and over, adult: Secondary | ICD-10-CM

## 2020-01-27 DIAGNOSIS — E7849 Other hyperlipidemia: Secondary | ICD-10-CM

## 2020-01-27 DIAGNOSIS — R5383 Other fatigue: Secondary | ICD-10-CM

## 2020-01-27 DIAGNOSIS — E559 Vitamin D deficiency, unspecified: Secondary | ICD-10-CM | POA: Diagnosis not present

## 2020-01-27 DIAGNOSIS — Z1331 Encounter for screening for depression: Secondary | ICD-10-CM | POA: Diagnosis not present

## 2020-01-27 DIAGNOSIS — Z0289 Encounter for other administrative examinations: Secondary | ICD-10-CM

## 2020-01-27 DIAGNOSIS — R1319 Other dysphagia: Secondary | ICD-10-CM

## 2020-01-27 DIAGNOSIS — I890 Lymphedema, not elsewhere classified: Secondary | ICD-10-CM

## 2020-01-27 MED ORDER — OMEPRAZOLE 20 MG PO CPDR: 20.0000 mg | DELAYED_RELEASE_CAPSULE | Freq: Every day | ORAL | 3 refills | Status: DC

## 2020-01-28 LAB — LIPID PANEL WITH LDL/HDL RATIO
Cholesterol, Total: 146 mg/dL (ref 100–199)
HDL: 40 mg/dL (ref >39)
LDL Chol Calc (NIH): 90 mg/dL (ref 0–99)
LDL/HDL Ratio: 2.3 ratio (ref 0.0–3.2)
Triglycerides: 83 mg/dL (ref 0–149)
VLDL Cholesterol Cal: 16 mg/dL (ref 5–40)

## 2020-01-28 LAB — VITAMIN D 25 HYDROXY (VIT D DEFICIENCY, FRACTURES): Vit D, 25-Hydroxy: 16.6 ng/mL — ABNORMAL LOW (ref 30.0–100.0)

## 2020-01-28 LAB — INSULIN, RANDOM: INSULIN: 27.2 u[IU]/mL — ABNORMAL HIGH (ref 2.6–24.9)

## 2020-01-28 LAB — HEMOGLOBIN A1C
Est. average glucose Bld gHb Est-mCnc: 126 mg/dL
Hgb A1c MFr Bld: 6 % — ABNORMAL HIGH (ref 4.8–5.6)

## 2020-01-28 LAB — TSH: TSH: 2.63 u[IU]/mL (ref 0.450–4.500)

## 2020-01-28 LAB — T3: T3, Total: 126 ng/dL (ref 71–180)

## 2020-01-28 LAB — T4, FREE: Free T4: 1.29 ng/dL (ref 0.82–1.77)

## 2020-01-28 NOTE — Progress Notes (Signed)
Dear Dr. Mitchel Honour,   Thank you for referring Michele Ayers to our clinic. The following note includes my evaluation and treatment recommendations.  Chief Complaint:   OBESITY Michele Ayers (MR# [id number]) is a 55 y.o. female who presents for evaluation and treatment of obesity and related comorbidities. Current BMI is Body mass index is 65.66 kg/m. Michele Ayers has been struggling with her weight for many years and has been unsuccessful in either losing weight, maintaining weight loss, or reaching her healthy weight goal.  Michele Ayers is currently in the action stage of change and ready to dedicate time achieving and maintaining a healthier weight. Michele Ayers is interested in becoming our patient and working on intensive lifestyle modifications including (but not limited to) diet and exercise for weight loss.  Michele Ayers does like to cook, especially baking.  She denies cravings.  She skips meals, primarily breakfast and lunch.  Michele Ayers's habits were reviewed today and are as follows: Her family eats meals together, she thinks her family will eat healthier with her, her desired weight loss is 178 pounds, she has been heavy most of her life, she started gaining excessive weight 1 year ago, her heaviest weight ever was her current weight, she skips breakfast and/or lunch frequently, she frequently makes poor food choices, she frequently eats larger portions than normal and she struggles with emotional eating.  Depression Screen Michele Ayers's Food and Mood (modified PHQ-9) score was 18.  Depression screen Michele Ayers 2/9 01/27/2020  Decreased Interest 3  Down, Depressed, Hopeless 1  PHQ - 2 Score 4  Altered sleeping 3  Tired, decreased energy 3  Change in appetite 2  Feeling bad or failure about yourself  3  Trouble concentrating 2  Moving slowly or fidgety/restless 1  Suicidal thoughts 0  PHQ-9 Score 18  Difficult doing work/chores Somewhat difficult   Subjective:   1. Other fatigue Michele Ayers admits to daytime  somnolence and reports waking up still tired. Patent has a history of symptoms of daytime fatigue, morning fatigue, morning headache and snoring. Michele Ayers generally gets 3 or 4 hours of sleep per night, and states that she has poor quality sleep. Snoring is present. Apneic episodes are not present. Epworth Sleepiness Score is 14.  2. SOB (shortness of breath) on exertion Michele Ayers notes increasing shortness of breath with exercising and seems to be worsening over time with weight gain. She notes getting out of breath sooner with activity than she used to. This has gotten worse recently. Michele Ayers denies shortness of breath at rest or orthopnea.  3. Lymphedema of both lower extremities Fluctuates.  She is taking Lasix.  4. Prediabetes Michele Ayers has a diagnosis of prediabetes based on her elevated HgA1c and was informed this puts her at greater risk of developing diabetes. She continues to work on diet and exercise to decrease her risk of diabetes. She denies nausea or hypoglycemia.  She is taking metformin twice daily.  A1c is 6.1.  Lab Results  Component Value Date   HGBA1C 6.1 (H) 07/15/2019   5. Other hyperlipidemia Michele Ayers has hyperlipidemia and has been trying to improve her cholesterol levels with intensive lifestyle modification including a low saturated fat diet, exercise and weight loss. She denies any chest pain, claudication or myalgias.  She is taking Crestor.    Lab Results  Component Value Date   ALT 11 07/15/2019   AST 11 07/15/2019   ALKPHOS 84 07/15/2019   BILITOT 0.4 07/15/2019   Lab Results  Component Value Date   CHOL 184  07/15/2019   HDL 36 (H) 07/15/2019   LDLCALC 133 (H) 07/15/2019   TRIG 82 07/15/2019   CHOLHDL 5.1 (H) 07/15/2019   6. Other dysphagia She has difficulty with solid food.  Also with reflux.  7. Vitamin D deficiency She is currently taking no vitamin D supplement.   8. Depression screening Michele Ayers was screened for depression as part of her new patient workup  today.  PHQ-9 is 18.  9. At risk for activity intolerance Michele Ayers is at risk for activity intolerance due to obesity, fatigue, and shortness of breath.  Assessment/Plan:   1. Other fatigue Michele Ayers does feel that her weight is causing her energy to be lower than it should be. Fatigue may be related to obesity, depression or many other causes. Labs will be ordered, and in the meanwhile, Michele Ayers will focus on self care including making healthy food choices, increasing physical activity and focusing on stress reduction.  - EKG 12-Lead - Hemoglobin A1c - Insulin, random - Lipid Panel With LDL/HDL Ratio - T3 - T4, free - TSH - VITAMIN D 25 Hydroxy (Vit-D Deficiency, Fractures) - TSH+T4F+T3Free  2. SOB (shortness of breath) on exertion Michele Ayers does feel that she gets out of breath more easily that she used to when she exercises. Michele Ayers's shortness of breath appears to be obesity related and exercise induced. She has agreed to work on weight loss and gradually increase exercise to treat her exercise induced shortness of breath. Will continue to monitor closely.  - Lipid Panel With LDL/HDL Ratio - TSH+T4F+T3Free  3. Lymphedema of both lower extremities Discussed lymphedema pump.  4. Prediabetes Michele Ayers will continue to work on weight loss, exercise, and decreasing simple carbohydrates to help decrease the risk of diabetes.  Will check insulin level and A1c today.   - Hemoglobin A1c - Insulin, random  5. Other hyperlipidemia Cardiovascular risk and specific lipid/LDL goals reviewed.  We discussed several lifestyle modifications today and Michele Ayers will continue to work on diet, exercise and weight loss efforts. Orders and follow up as documented in patient record.  Will check lipid panel today.  Counseling Intensive lifestyle modifications are the first line treatment for this issue. . Dietary changes: Increase soluble fiber. Decrease simple carbohydrates. . Exercise changes: Moderate to  vigorous-intensity aerobic activity 150 minutes per week if tolerated. . Lipid-lowering medications: see documented in medical record.  - Lipid Panel With LDL/HDL Ratio  6. Other dysphagia Will talk to primary care about swallowing difficulties.  Will place referral to GI as well.  Will also start Prilosec 20 mg daily.  Prescription sent to pharmacy today.  7. Vitamin D deficiency Low Vitamin D level contributes to fatigue and are associated with obesity, breast, and colon cancer.  Will check vitamin D level today, as per below.  - VITAMIN D 25 Hydroxy (Vit-D Deficiency, Fractures)  8. Depression screening Ziyon had a positive depression screening. Depression is commonly associated with obesity and often results in emotional eating behaviors. We will monitor this closely and work on CBT to help improve the non-hunger eating patterns. Referral to Psychology may be required if no improvement is seen as she continues in our clinic.  9. At risk for activity intolerance Beulah was given approximately 15 minutes of exercise intolerance counseling today. She is 55 y.o. female and has risk factors exercise intolerance including obesity. We discussed intensive lifestyle modifications today with an emphasis on specific weight loss instructions and strategies. Lexi will slowly increase activity as tolerated.  Repetitive spaced learning was  employed today to elicit superior memory formation and behavioral change.  10. Class 3 severe obesity with serious comorbidity and body mass index (BMI) of 60.0 to 69.9 in adult, unspecified obesity type [facility])  Makinnley is currently in the action stage of change and her goal is to continue with weight loss efforts. I recommend Talley begin the structured treatment plan as follows:  She has agreed to the Category 4 Plan.  She will work on meal planning and decreasing portion sizes.  Labs were reviewed from 11/19/2019, including CMP, CBC, and glucose.  Exercise goals:  No exercise has been prescribed at this time.   Behavioral modification strategies: increasing lean protein intake, decreasing simple carbohydrates, increasing vegetables, increasing water intake, decreasing eating out, no skipping meals, meal planning and cooking strategies, keeping healthy foods in the home and planning for success.  She was informed of the importance of frequent follow-up visits to maximize her success with intensive lifestyle modifications for her multiple health conditions. She was informed we would discuss her lab results at her next visit unless there is a critical issue that needs to be addressed sooner. Meaghann agreed to keep her next visit at the agreed upon time to discuss these results.  Objective:   Blood pressure 113/69, pulse 87, temperature 98.1 F (36.7 C), temperature source Oral, height 5\' 2"  (1.575 m), weight (!) 359 lb (162.8 kg), SpO2 96 %. Body mass index is 65.66 kg/m.  EKG: Normal sinus rhythm, rate 89 bpm.  Indirect Calorimeter completed today shows a VO2 of 402 and a REE of 2800.    General: Cooperative, alert, well developed, in no acute distress. HEENT: Conjunctivae and lids unremarkable. Cardiovascular: Regular rhythm.  Lungs: Normal work of breathing. Neurologic: No focal deficits.  Extremities:  Lymphedema of bilateral lower extremities.  Lab Results  Component Value Date   CREATININE 0.50 11/19/2019   BUN 11 11/19/2019   NA 136 11/19/2019   K 4.4 11/19/2019   CL 99 11/19/2019   CO2 24 07/15/2019   Lab Results  Component Value Date   ALT 11 07/15/2019   AST 11 07/15/2019   ALKPHOS 84 07/15/2019   BILITOT 0.4 07/15/2019   Lab Results  Component Value Date   HGBA1C 6.0 (H) 01/27/2020   HGBA1C 6.1 (H) 07/15/2019   HGBA1C 5.8 (H) 01/03/2019   Lab Results  Component Value Date   INSULIN 27.2 (H) 01/27/2020   Lab Results  Component Value Date   TSH 2.630 01/27/2020   Lab Results  Component Value Date   CHOL 146 01/27/2020    HDL 40 01/27/2020   LDLCALC 90 01/27/2020   TRIG 83 01/27/2020   CHOLHDL 5.1 (H) 07/15/2019   Lab Results  Component Value Date   WBC 9.0 09/04/2019   HGB 13.9 11/19/2019   HCT 41.0 11/19/2019   MCV 95 09/04/2019   PLT 375 09/04/2019   Attestation Statements:   Reviewed by clinician on day of visit: allergies, medications, problem list, medical history, surgical history, family history, social history, and previous encounter notes.  I, Water quality scientist, CMA, am acting as Location manager for [employer], DO  I have reviewed the above documentation for accuracy and completeness, and I agree with the above. Jearld Lesch, DO

## 2020-01-29 ENCOUNTER — Encounter (INDEPENDENT_AMBULATORY_CARE_PROVIDER_SITE_OTHER): Payer: Self-pay | Admitting: Bariatrics

## 2020-01-29 DIAGNOSIS — R7303 Prediabetes: Secondary | ICD-10-CM | POA: Insufficient documentation

## 2020-01-29 DIAGNOSIS — E559 Vitamin D deficiency, unspecified: Secondary | ICD-10-CM | POA: Insufficient documentation

## 2020-02-10 ENCOUNTER — Encounter (INDEPENDENT_AMBULATORY_CARE_PROVIDER_SITE_OTHER): Payer: Self-pay | Admitting: Bariatrics

## 2020-02-10 ENCOUNTER — Ambulatory Visit (INDEPENDENT_AMBULATORY_CARE_PROVIDER_SITE_OTHER): Payer: No Typology Code available for payment source | Admitting: Bariatrics

## 2020-02-10 ENCOUNTER — Other Ambulatory Visit: Payer: Self-pay

## 2020-02-10 VITALS — BP 110/69 | HR 81 | Temp 98.7°F | Ht 62.0 in | Wt 353.0 lb

## 2020-02-10 DIAGNOSIS — E559 Vitamin D deficiency, unspecified: Secondary | ICD-10-CM | POA: Diagnosis not present

## 2020-02-10 DIAGNOSIS — Z9189 Other specified personal risk factors, not elsewhere classified: Secondary | ICD-10-CM

## 2020-02-10 DIAGNOSIS — Z6841 Body Mass Index (BMI) 40.0 and over, adult: Secondary | ICD-10-CM

## 2020-02-10 DIAGNOSIS — R7303 Prediabetes: Secondary | ICD-10-CM

## 2020-02-10 MED ORDER — VITAMIN D (ERGOCALCIFEROL) 1.25 MG (50000 UNIT) PO CAPS: 50000.0000 [IU] | ORAL_CAPSULE | ORAL | 0 refills | Status: DC

## 2020-02-11 NOTE — Progress Notes (Signed)
Chief Complaint:   OBESITY Michele Ayers is here to discuss her progress with her obesity treatment plan along with follow-up of her obesity related diagnoses. Michele Ayers is on the Category 4 Plan and states she is following her eating plan approximately 100% of the time. Michele Ayers states she is doing 0 minutes 0 times per week.  Today's visit was #: 2 Starting weight: 359 lbs Starting date: 01/27/2020 Today's weight: 253 lbs Today's date: 02/10/2020 Total lbs lost to date: 6 Total lbs lost since last in-office visit: 6  Interim History: Michele Ayers is down 6 lbs from her first visit. She notes it was hard at first, but then got better.  Subjective:   1. Pre-diabetes Michele Ayers has a new diagnosis of pre-diabetes. She has a family history of diabetes mellitus. Last A1c was 6.0 and insulin 27.2. She is already on metformin.  2. Vitamin D deficiency Michele Ayers is not taking Vit D. Last Vit D level was 16.6.  3. At risk for osteoporosis Michele Ayers is at higher risk of osteopenia and osteoporosis due to Vitamin D deficiency.   Assessment/Plan:   1. Pre-diabetes Michele Ayers will continue metformin, and will continue to work on weight loss, diet, exercise, and decreasing simple carbohydrates to help decrease the risk of diabetes.   2. Vitamin D deficiency Low Vitamin D level contributes to fatigue and are associated with obesity, breast, and colon cancer. Michele Ayers agreed to start prescription Vitamin D 50,000 IU every week with no refills. She will follow-up for routine testing of Vitamin D, at least 2-3 times per year to avoid over-replacement.  3. At risk for osteoporosis Michele Ayers was given approximately 15 minutes of osteoporosis prevention counseling today. Michele Ayers is at risk for osteopenia and osteoporosis due to her Vitamin D deficiency. She was encouraged to take her Vitamin D and follow her higher calcium diet and increase strengthening exercise to help strengthen her bones and decrease her risk of osteopenia and  osteoporosis.  Repetitive spaced learning was employed today to elicit superior memory formation and behavioral change.  4. Class 3 severe obesity with serious comorbidity and body mass index (BMI) of 60.0 to 69.9 in adult, unspecified obesity type [facility]) Michele Ayers is currently in the action stage of change. As such, her goal is to continue with weight loss efforts. She has agreed to the Category 4 Plan.   Continue to adhere closely to the plan. I reviewed labs from 01/27/2020 with the patient today.  Exercise goals: No exercise has been prescribed at this time.  Behavioral modification strategies: increasing lean protein intake, decreasing simple carbohydrates, increasing vegetables, increasing water intake, decreasing eating out, no skipping meals, meal planning and cooking strategies, keeping healthy foods in the home and planning for success.  Michele Ayers has agreed to follow-up with our clinic in 2 weeks. She was informed of the importance of frequent follow-up visits to maximize her success with intensive lifestyle modifications for her multiple health conditions.   Objective:   Blood pressure 110/69, pulse 81, temperature 98.7 F (37.1 C), height 5\' 2"  (1.575 m), weight (!) 353 lb (160.1 kg), SpO2 96 %. Body mass index is 64.56 kg/m.  General: Cooperative, alert, well developed, in no acute distress. HEENT: Conjunctivae and lids unremarkable. Cardiovascular: Regular rhythm.  Lungs: Normal work of breathing. Neurologic: No focal deficits.   Lab Results  Component Value Date   CREATININE 0.50 11/19/2019   BUN 11 11/19/2019   NA 136 11/19/2019   K 4.4 11/19/2019   CL 99 11/19/2019  CO2 24 07/15/2019   Lab Results  Component Value Date   ALT 11 07/15/2019   AST 11 07/15/2019   ALKPHOS 84 07/15/2019   BILITOT 0.4 07/15/2019   Lab Results  Component Value Date   HGBA1C 6.0 (H) 01/27/2020   HGBA1C 6.1 (H) 07/15/2019   HGBA1C 5.8 (H) 01/03/2019   Lab Results  Component Value  Date   INSULIN 27.2 (H) 01/27/2020   Lab Results  Component Value Date   TSH 2.630 01/27/2020   Lab Results  Component Value Date   CHOL 146 01/27/2020   HDL 40 01/27/2020   LDLCALC 90 01/27/2020   TRIG 83 01/27/2020   CHOLHDL 5.1 (H) 07/15/2019   Lab Results  Component Value Date   WBC 9.0 09/04/2019   HGB 13.9 11/19/2019   HCT 41.0 11/19/2019   MCV 95 09/04/2019   PLT 375 09/04/2019   No results found for: IRON, TIBC, FERRITIN  Attestation Statements:   Reviewed by clinician on day of visit: allergies, medications, problem list, medical history, surgical history, family history, social history, and previous encounter notes.   Wilhemena Durie, am acting as Location manager for [employer], DO.  I have reviewed the above documentation for accuracy and completeness, and I agree with the above. Jearld Lesch, DO

## 2020-02-13 ENCOUNTER — Encounter (INDEPENDENT_AMBULATORY_CARE_PROVIDER_SITE_OTHER): Payer: Self-pay | Admitting: Bariatrics

## 2020-02-25 ENCOUNTER — Ambulatory Visit (INDEPENDENT_AMBULATORY_CARE_PROVIDER_SITE_OTHER): Payer: No Typology Code available for payment source | Admitting: Bariatrics

## 2020-03-14 ENCOUNTER — Other Ambulatory Visit: Payer: Self-pay | Admitting: Emergency Medicine

## 2020-03-14 DIAGNOSIS — I890 Lymphedema, not elsewhere classified: Secondary | ICD-10-CM

## 2020-03-14 NOTE — Telephone Encounter (Signed)
Requested medication (s) are due for refill today: -  Requested medication (s) are on the active medication list: expired medication  Last refill:  09/30/19  Future visit scheduled: yes  Notes to clinic:  prescription expired 12/29/19   Requested Prescriptions  Pending Prescriptions Disp Refills   furosemide (LASIX) 20 MG tablet [Pharmacy Med Name: FUROSEMIDE 20 MG TABLET] 180 tablet 1    Sig: TAKE 2 TABLETS BY MOUTH EVERY DAY      Cardiovascular:  Diuretics - Loop Failed - 03/14/2020 12:32 PM      Failed - Ca in normal range and within 360 days    Calcium  Date Value Ref Range Status  07/15/2019 8.6 (L) 8.7 - 10.2 mg/dL Final   Calcium, Ion  Date Value Ref Range Status  11/19/2019 1.02 (L) 1.15 - 1.40 mmol/L Final          Passed - K in normal range and within 360 days    Potassium  Date Value Ref Range Status  11/19/2019 4.4 3.5 - 5.1 mmol/L Final          Passed - Na in normal range and within 360 days    Sodium  Date Value Ref Range Status  11/19/2019 136 135 - 145 mmol/L Final  07/15/2019 137 134 - 144 mmol/L Final          Passed - Cr in normal range and within 360 days    Creatinine, Ser  Date Value Ref Range Status  11/19/2019 0.50 0.44 - 1.00 mg/dL Final          Passed - Last BP in normal range    BP Readings from Last 1 Encounters:  02/10/20 110/69          Passed - Valid encounter within last 6 months    Recent Outpatient Visits           2 months ago Lymphedema of both lower extremities   [facility] [place], [place], [place]   7 months ago Chronic bilateral low back pain, unspecified whether sciatica present   [facility] at [facility], Fenton Malling, MD   8 months ago Abnormal vaginal bleeding   [facility] at [facility], [name], MD   9 months ago Closed fracture of left foot, initial encounter   [facility] at [place], [name], MD   1 year ago Lymphedema of both lower extremities   Primary [facility] at  [place], [name], MD       Future Appointments             In 3 months [place], [name], MD [place] [facility]

## 2020-03-16 ENCOUNTER — Telehealth: Payer: Self-pay

## 2020-03-16 NOTE — Telephone Encounter (Signed)
30 day supply has been sent  

## 2020-03-16 NOTE — Telephone Encounter (Signed)
Please schedule f/u appt for med refills

## 2020-03-16 NOTE — Telephone Encounter (Signed)
Called patient to notify that 30 day supply was sent; unable to leave a message. Patient's voicemail hasn't been set up yet.

## 2020-03-16 NOTE — Telephone Encounter (Signed)
Received call from pt. And informed them of the 1 month refill on their medication and necessity of scheduling a new appt for refills. Pt. Refused to schedule an appt.

## 2020-03-17 NOTE — Telephone Encounter (Signed)
Noted! No further refills without appt

## 2020-03-31 ENCOUNTER — Other Ambulatory Visit: Payer: Self-pay | Admitting: Emergency Medicine

## 2020-03-31 DIAGNOSIS — I890 Lymphedema, not elsewhere classified: Secondary | ICD-10-CM

## 2020-04-06 ENCOUNTER — Other Ambulatory Visit: Payer: Self-pay | Admitting: Emergency Medicine

## 2020-04-06 DIAGNOSIS — M461 Sacroiliitis, not elsewhere classified: Secondary | ICD-10-CM

## 2020-07-07 ENCOUNTER — Other Ambulatory Visit: Payer: Self-pay

## 2020-07-07 ENCOUNTER — Encounter: Payer: Self-pay | Admitting: Emergency Medicine

## 2020-07-07 ENCOUNTER — Ambulatory Visit (INDEPENDENT_AMBULATORY_CARE_PROVIDER_SITE_OTHER): Payer: 59 | Admitting: Emergency Medicine

## 2020-07-07 VITALS — BP 122/76 | HR 85 | Temp 98.0°F | Ht 62.0 in | Wt 352.0 lb

## 2020-07-07 DIAGNOSIS — R7303 Prediabetes: Secondary | ICD-10-CM | POA: Diagnosis not present

## 2020-07-07 DIAGNOSIS — I890 Lymphedema, not elsewhere classified: Secondary | ICD-10-CM | POA: Diagnosis not present

## 2020-07-07 MED ORDER — FUROSEMIDE 20 MG PO TABS: 40.0000 mg | ORAL_TABLET | Freq: Every day | ORAL | 0 refills | Status: DC

## 2020-07-07 NOTE — Assessment & Plan Note (Signed)
Diet and nutrition discussed.  Advised to decrease amount of carbohydrate daily intake.  Need to follow-up with medical weight management clinic.  May be a candidate for bariatric surgery which was discussed.

## 2020-07-07 NOTE — Assessment & Plan Note (Signed)
Continues metformin 1000 mg twice a day.  Diet and nutrition discussed.

## 2020-07-07 NOTE — Assessment & Plan Note (Signed)
Need to lose weight addressed.  Diet and nutrition discussed.  Lasix refilled.

## 2020-07-07 NOTE — Patient Instructions (Signed)
Lymphedema Lymphedema is swelling that is caused by the abnormal collection of lymph in the tissues under the skin. Lymph is excess fluid from the tissues in your body that is removed through the lymphatic system. This system is part of your body's defense system (immune system) and includes lymph nodes and lymph vessels. The lymph vessels collect and carry the excess fluid, fats, proteins, and waste from the tissues of the body to the bloodstream. This system also works to clean and remove bacteria and waste products from the body. Lymphedema occurs when the lymphatic system is blocked. When the lymph vessels or lymph nodes are blocked or damaged, lymph does not drain properly. This causes an abnormal buildup of lymph, which leads to swelling in the affected area. This may include the trunk area, or an arm or leg. Lymphedema cannot be cured by medicines, but various methods can be used to help reduce the swelling. What are the causes? The cause of this condition depends on the type of lymphedema that you have. Primary lymphedema is caused by the absence of lymph vessels or having abnormal lymph vessels at birth. Secondary lymphedema occurs when lymph vessels are blocked or damaged. Secondary lymphedema is more common. Common causes of lymph vessel blockage include: Skin infection, such as cellulitis. Infection by parasites (filariasis). Injury. Radiation therapy. Cancer. Formation of scar tissue. Surgery. What are the signs or symptoms? Symptoms of this condition include: Swelling of the arm or leg. A heavy or tight feeling in the arm or leg. Swelling of the feet, toes, or fingers. Shoes or rings may fit more tightly than before. Redness of the skin over the affected area. Limited movement of the affected limb. Sensitivity to touch or discomfort in the affected limb. How is this diagnosed? This condition may be diagnosed based on: Your symptoms and medical history. A physical  exam. Bioimpedance spectroscopy. In this test, painless electrical currents are used to measure fluid levels in your body. Imaging tests, such as: MRI. CT scan. Duplex ultrasound. This test uses sound waves to produce images of the vessels and the blood flow on a screen. Lymphoscintigraphy. In this test, a low dose of a radioactive substance is injected to trace the flow of lymph through your lymph vessels. Lymphangiography. In this test, a contrast dye is injected into the lymph vessel to help show blockages. How is this treated? If an underlying condition is causing the lymphedema, that condition will be treated. For example, antibiotic medicines may be used to treat an infection. Treatment for this condition will depend on the cause of your lymphedema. Treatment may include: Complete decongestive therapy (CDT). This is done by a certified lymphedema therapist to reduce fluid congestion. This therapy includes: Skin care. Compression wrapping of the affected area. Manual lymph drainage. This is a special massage technique that promotes lymph drainage out of a limb. Specific exercises. Certain exercises can help fluid move out of the affected limb. Compression. Various methods may be used to apply pressure to the affected limb to reduce the swelling. They include: Wearing compression stockings or sleeves on the affected limb. Wrapping the affected limb with special bandages. Surgery. This is usually done for severe cases only. For example, surgery may be done if you have trouble moving the limb or if the swelling does not get better with other treatments. Follow these instructions at home: Self-care The affected area is more likely to become injured or infected. Take these steps to help prevent infection: Keep the affected area clean   and dry. Use approved creams or lotions to keep the skin moisturized. Protect your skin from cuts: Use gloves while cooking or gardening. Do not walk  barefoot. If you shave the affected area, use an electric razor. Do not wear tight clothes, shoes, or jewelry. Eat a healthy diet that includes a lot of fruits and vegetables. Activity Do exercises as told by your health care provider. Do not sit with your legs crossed. When possible, keep the affected limb raised (elevated) above the level of your heart. Avoid carrying things with an arm that is affected by lymphedema. General instructions Wear compression stockings or sleeves as told by your health care provider. Note any changes in size of the affected limb. You may be instructed to take regular measurements and keep track of them. Take over-the-counter and prescription medicines only as told by your health care provider. If you were prescribed an antibiotic medicine, take or apply it as told by your health care provider. Do not stop using the antibiotic even if you start to feel better or if your condition improves. Do not use heating pads or ice packs on the affected area. Avoid having blood draws, IV insertions, or blood pressure checks on the affected limb. Keep all follow-up visits. This is important. Contact a health care provider if you: Continue to have swelling in your limb. Have fluid leaking from the skin of your swollen limb. Have a cut that does not heal. Have redness or pain in the affected area. Develop purplish spots, rash, blisters, or sores (lesions) on your affected limb. Get help right away if you: Have new swelling in your limb that starts suddenly. Have shortness of breath or chest pain. Have a fever or chills. These symptoms may represent a serious problem that is an emergency. Do not wait to see if the symptoms will go away. Get medical help right away. Call your local emergency services (911 in the [place] Do not drive yourself to the hospital. Summary Lymphedema is swelling that is caused by the abnormal collection of lymph in the tissues under the  skin. Lymph is fluid from the tissues in your body that is removed through the lymphatic system. This system collects and carries excess fluid, fats, proteins, and wastes from the tissues of the body to the bloodstream. Lymphedema causes swelling, pain, and redness in the affected area. This may include the trunk area, or an arm or leg. Treatment for this condition may depend on the cause of your lymphedema. Treatment may include treating the underlying cause, complete decongestive therapy (CDT), compression methods, or surgery. This information is not intended to replace advice given to you by your health care provider. Make sure you discuss any questions you have with your health care provider. Document Revised: 10/16/2019 Document Reviewed: 10/16/2019 Elsevier Patient Education  2022 [employer]  

## 2020-07-07 NOTE — Progress Notes (Signed)
Michele Ayers 55 y.o.   Chief Complaint  Patient presents with   Follow-up    Pt would like a refill of lasix    HISTORY OF PRESENT ILLNESS: This is a 55 y.o. female with history of morbid obesity and bilateral lymphedema here for follow-up on refill of Lasix medication. Last time she was seen by me she was referred to medical weight management clinic. Was seen on 02/10/2020 with the following assessment and plan: Assessment/Plan:    1. Pre-diabetes Michele Ayers will continue metformin, and will continue to work on weight loss, diet, exercise, and decreasing simple carbohydrates to help decrease the risk of diabetes.    2. Vitamin D deficiency Low Vitamin D level contributes to fatigue and are associated with obesity, breast, and colon cancer. Michele Ayers agreed to start prescription Vitamin D 50,000 IU every week with no refills. She will follow-up for routine testing of Vitamin D, at least 2-3 times per year to avoid over-replacement.   3. At risk for osteoporosis Michele Ayers was given approximately 15 minutes of osteoporosis prevention counseling today. Michele Ayers is at risk for osteopenia and osteoporosis due to her Vitamin D deficiency. She was encouraged to take her Vitamin D and follow her higher calcium diet and increase strengthening exercise to help strengthen her bones and decrease her risk of osteopenia and osteoporosis.   Repetitive spaced learning was employed today to elicit superior memory formation and behavioral change.   4. Class 3 severe obesity with serious comorbidity and body mass index (BMI) of 60.0 to 69.9 in adult, unspecified obesity type Michele Ayers) Michele Ayers is currently in the action stage of change. As such, her goal is to continue with weight loss efforts. She has agreed to the Category 4 Plan.    Continue to adhere closely to the plan. I reviewed labs from 01/27/2020 with the patient today.   Exercise goals: No exercise has been prescribed at this time.   Behavioral modification  strategies: increasing lean protein intake, decreasing simple carbohydrates, increasing vegetables, increasing water intake, decreasing eating out, no skipping meals, meal planning and cooking strategies, keeping healthy foods in the home and planning for success.   Michele Ayers has agreed to follow-up with our clinic in 2 weeks. She was informed of the importance of frequent follow-up visits to maximize her success with intensive lifestyle modifications for her multiple health conditions.   However she did not follow-up as recommended. No significant clinical improvement.  Plan stays the same.  HPI   Prior to Admission medications   Medication Sig Start Date End Date Taking? Authorizing Provider  Acetaminophen (TYLENOL) 325 MG CAPS Take 650 mg by mouth. PRN   Yes [provider]  aspirin EC 81 MG tablet Take 81 mg by mouth daily. Swallow whole.   Yes [provider]  furosemide (LASIX) 20 MG tablet TAKE 2 TABLETS BY MOUTH EVERY DAY 03/16/20  Yes [name], [name], MD  ibuprofen (ADVIL) 600 MG tablet Take 1 tablet (600 mg total) by mouth every 6 (six) hours as needed. 11/19/19  Yes [name], MD  medroxyPROGESTERone (PROVERA) 10 MG tablet Take 1 tablet (10 mg total) by mouth daily. 12/16/19  Yes [name], MD  metFORMIN (GLUCOPHAGE) 1000 MG tablet Take 1 tablet (1,000 mg total) by mouth 2 (two) times daily with a meal. 01/08/20  Yes [name], [name], MD  rosuvastatin (CRESTOR) 10 MG tablet Take 1 tablet (10 mg total) by mouth daily. 01/08/20  Yes [name], MD  Vitamin D,  Ergocalciferol, (DRISDOL) 1.25 MG (50000 UNIT) CAPS capsule Take 1 capsule (50,000 Units total) by mouth every 7 (seven) days. 02/10/20  Yes [name], DO  omeprazole (PRILOSEC) 20 MG capsule Take 1 capsule (20 mg total) by mouth daily. 01/27/20   [name], DO    No Known Allergies  Patient Active Problem List   Diagnosis Date Noted   Prediabetes 01/29/2020   Vitamin D  deficiency 01/29/2020   Body mass index (BMI) of 60.0-69.9 in adult [facility]) 07/15/2019   Abnormal vaginal bleeding 07/15/2019   Perimenopause 07/15/2019   Lymphedema of both lower extremities 01/03/2019   Venous stasis dermatitis 02/22/2013   Sleep apnea 01/23/2013   Hyperlipidemia with target LDL less than 100 06/14/2012   COPD (chronic obstructive pulmonary disease) ([place]) 06/08/2012   Family history of lung cancer 06/08/2012   Former smoker 06/08/2012    Past Medical History:  Diagnosis Date   Back pain    Edema of both lower legs    GERD (gastroesophageal reflux disease)    High cholesterol    Joint pain    Lipoma of back    Lymph edema    Prediabetes    SOB (shortness of breath)    Swallowing difficulty     Past Surgical History:  Procedure Laterality Date   HYSTEROSCOPY WITH D & C N/A 11/19/2019   Procedure: DILATATION AND CURETTAGE /HYSTEROSCOPY;  Surgeon: [name], MD;  Location: [facility];  Service: Gynecology;  Laterality: N/A;   TUBAL LIGATION      Social History   Socioeconomic History   Marital status: Single    Spouse name: Not on file   Number of children: Not on file   Years of education: Not on file   Highest education level: Not on file  Occupational History   Not on file  Tobacco Use   Smoking status: Former    Pack years: 0.00    Types: Cigarettes    Quit date: 10/18/2019    Years since quitting: 0.7   Smokeless tobacco: Never   Tobacco comments:    per pt stopped a month ago only smoked a pack in  3 days  Vaping Use   Vaping Use: Never used  Substance and Sexual Activity   Alcohol use: No   Drug use: No   Sexual activity: Not Currently    Birth control/protection: Surgical  Other Topics Concern   Not on file  Social History Narrative   Not on file   Social Determinants of Health   Financial Resource Strain: Not on file  Food Insecurity: Food Insecurity Present   Worried About [place] in the Last Year: Never true    Ran Out of Food in the Last Year: Sometimes true  Transportation Needs: No Transportation Needs   Lack of Transportation (Medical): No   Lack of Transportation (Non-Medical): No  Physical Activity: Not on file  Stress: Not on file  Social Connections: Not on file  Intimate Partner Violence: Not on file    Family History  Problem Relation Age of Onset   COPD Other    Cancer Other    Obesity Mother    High blood pressure Father    High Cholesterol Father    Cancer Father    Obesity Father      Review of Systems  Constitutional: Negative.  Negative for chills and fever.  HENT: Negative.  Negative for congestion and sore throat.   Respiratory: Negative.  Negative for cough and shortness of breath.   Cardiovascular:  Positive for leg swelling. Negative for chest pain and palpitations.  Gastrointestinal:  Negative for abdominal pain, diarrhea, nausea and vomiting.  Genitourinary: Negative.   Musculoskeletal: Negative.   Skin: Negative.  Negative for rash.  Neurological: Negative.  Negative for dizziness and headaches.  All other systems reviewed and are negative.  Today's Vitals   07/07/20 1003  BP: 122/76  Pulse: 85  Temp: 98 F (36.7 C)  TempSrc: Oral  SpO2: 97%  Weight: (!) 352 lb (159.7 kg)  Height: 5\' 2"  (1.575 m)   Body mass index is 64.38 kg/m. Wt Readings from Last 3 Encounters:  07/07/20 (!) 352 lb (159.7 kg)  02/10/20 (!) 353 lb (160.1 kg)  01/27/20 (!) 359 lb (162.8 kg)    Physical Exam Vitals reviewed.  Constitutional:      Appearance: She is obese.  HENT:     Head: Normocephalic.  Eyes:     Extraocular Movements: Extraocular movements intact.     Pupils: Pupils are equal, round, and reactive to light.  Cardiovascular:     Rate and Rhythm: Normal rate and regular rhythm.     Pulses: Normal pulses.     Heart sounds: Normal heart sounds.  Pulmonary:     Effort: Pulmonary effort is normal.     Breath sounds: Normal breath sounds.   Musculoskeletal:     Cervical back: Normal range of motion and neck supple.     Right lower leg: Edema present.     Left lower leg: Edema present.  Skin:    General: Skin is warm and dry.     Capillary Refill: Capillary refill takes less than 2 seconds.  Neurological:     General: No focal deficit present.     Mental Status: She is alert and oriented to person, place, and time.  Psychiatric:        Mood and Affect: Mood normal.        Behavior: Behavior normal.     ASSESSMENT & PLAN: A total of 30 minutes was spent with the patient and counseling/coordination of care regarding preparation for this visit, review of most recent office visit notes, review of most recent medical weight management clinic note, chronic lymphedema and morbid obesity and cardiovascular risk factors associated with these conditions, education on nutrition and need to decrease daily carbohydrate intake, bariatric surgery option, review of all medications and use of Lasix, prognosis, documentation and need for follow-up.  Lymphedema of both lower extremities Need to lose weight addressed.  Diet and nutrition discussed.  Lasix refilled.  Morbid obesity ([place]) Diet and nutrition discussed.  Advised to decrease amount of carbohydrate daily intake.  Need to follow-up with medical weight management clinic.  May be a candidate for bariatric surgery which was discussed.  Prediabetes Continues metformin 1000 mg twice a day.  Diet and nutrition discussed.   Niki was seen today for follow-up.  Diagnoses and all orders for this visit:  Lymphedema of both lower extremities -     furosemide (LASIX) 20 MG tablet; Take 2 tablets (40 mg total) by mouth daily.  Morbid obesity ([place])  Prediabetes  Patient Instructions  Lymphedema  Lymphedema is swelling that is caused by the abnormal collection of lymph in the tissues under the skin. Lymph is excess fluid from the tissues in your body that is removed through the  lymphatic system. This system is part of your body's defense system (immune system) and includes  lymph nodes and lymph vessels. The lymph vessels collect and carry the excess fluid, fats, proteins, and waste from the tissues of the body to the bloodstream. This system also works to clean and remove bacteria andwaste products from the body. Lymphedema occurs when the lymphatic system is blocked. When the lymph vessels or lymph nodes are blocked or damaged, lymph does not drain properly. This causes an abnormal buildup of lymph, which leads to swelling in the affected area. This may include the trunk area, or an arm or leg. Lymphedema cannot becured by medicines, but various methods can be used to help reduce the swelling. What are the causes? The cause of this condition depends on the type of lymphedema that you have. Primary lymphedema is caused by the absence of lymph vessels or having abnormal lymph vessels at birth. Secondary lymphedema occurs when lymph vessels are blocked or damaged. Secondary lymphedema is more common. Common causes of lymph vessel blockage include: Skin infection, such as cellulitis. Infection by parasites (filariasis). Injury. Radiation therapy. Cancer. Formation of scar tissue. Surgery. What are the signs or symptoms? Symptoms of this condition include: Swelling of the arm or leg. A heavy or tight feeling in the arm or leg. Swelling of the feet, toes, or fingers. Shoes or rings may fit more tightly than before. Redness of the skin over the affected area. Limited movement of the affected limb. Sensitivity to touch or discomfort in the affected limb. How is this diagnosed? This condition may be diagnosed based on: Your symptoms and medical history. A physical exam. Bioimpedance spectroscopy. In this test, painless electrical currents are used to measure fluid levels in your body. Imaging tests, such as: MRI. CT scan. Duplex ultrasound. This test uses sound waves  to produce images of the vessels and the blood flow on a screen. Lymphoscintigraphy. In this test, a low dose of a radioactive substance is injected to trace the flow of lymph through your lymph vessels. Lymphangiography. In this test, a contrast dye is injected into the lymph vessel to help show blockages. How is this treated?  If an underlying condition is causing the lymphedema, that condition will betreated. For example, antibiotic medicines may be used to treat an infection. Treatment for this condition will depend on the cause of your lymphedema. Treatment may include: Complete decongestive therapy (CDT). This is done by a certified lymphedema therapist to reduce fluid congestion. This therapy includes: Skin care. Compression wrapping of the affected area. Manual lymph drainage. This is a special massage technique that promotes lymph drainage out of a limb. Specific exercises. Certain exercises can help fluid move out of the affected limb. Compression. Various methods may be used to apply pressure to the affected limb to reduce the swelling. They include: Wearing compression stockings or sleeves on the affected limb. Wrapping the affected limb with special bandages. Surgery. This is usually done for severe cases only. For example, surgery may be done if you have trouble moving the limb or if the swelling does not get better with other treatments. Follow these instructions at home: Self-care The affected area is more likely to become injured or infected. Take these steps to help prevent infection: Keep the affected area clean and dry. Use approved creams or lotions to keep the skin moisturized. Protect your skin from cuts: Use gloves while cooking or gardening. Do not walk barefoot. If you shave the affected area, use an Copy. Do not wear tight clothes, shoes, or jewelry. Eat a healthy diet that includes  a lot of fruits and vegetables. Activity Do exercises as told by your  health care provider. Do not sit with your legs crossed. When possible, keep the affected limb raised (elevated) above the level of your heart. Avoid carrying things with an arm that is affected by lymphedema. General instructions Wear compression stockings or sleeves as told by your health care provider. Note any changes in size of the affected limb. You may be instructed to take regular measurements and keep track of them. Take over-the-counter and prescription medicines only as told by your health care provider. If you were prescribed an antibiotic medicine, take or apply it as told by your health care provider. Do not stop using the antibiotic even if you start to feel better or if your condition improves. Do not use heating pads or ice packs on the affected area. Avoid having blood draws, IV insertions, or blood pressure checks on the affected limb. Keep all follow-up visits. This is important. Contact a health care provider if you: Continue to have swelling in your limb. Have fluid leaking from the skin of your swollen limb. Have a cut that does not heal. Have redness or pain in the affected area. Develop purplish spots, rash, blisters, or sores (lesions) on your affected limb. Get help right away if you: Have new swelling in your limb that starts suddenly. Have shortness of breath or chest pain. Have a fever or chills. These symptoms may represent a serious problem that is an emergency. Do not wait to see if the symptoms will go away. Get medical help right away. Call your local emergency services (911 in the [place] Do not drive yourself to the hospital. Summary Lymphedema is swelling that is caused by the abnormal collection of lymph in the tissues under the skin. Lymph is fluid from the tissues in your body that is removed through the lymphatic system. This system collects and carries excess fluid, fats, proteins, and wastes from the tissues of the body to the  bloodstream. Lymphedema causes swelling, pain, and redness in the affected area. This may include the trunk area, or an arm or leg. Treatment for this condition may depend on the cause of your lymphedema. Treatment may include treating the underlying cause, complete decongestive therapy (CDT), compression methods, or surgery. This information is not intended to replace advice given to you by your health care provider. Make sure you discuss any questions you have with your healthcare provider. Document Revised: 10/16/2019 Document Reviewed: 10/16/2019 Elsevier Patient Education  2022 [place], MD [facility]

## 2020-07-21 NOTE — Telephone Encounter (Signed)
error 

## 2020-07-22 ENCOUNTER — Encounter (INDEPENDENT_AMBULATORY_CARE_PROVIDER_SITE_OTHER): Payer: Self-pay | Admitting: Bariatrics

## 2020-07-22 ENCOUNTER — Other Ambulatory Visit: Payer: Self-pay

## 2020-07-22 ENCOUNTER — Ambulatory Visit (INDEPENDENT_AMBULATORY_CARE_PROVIDER_SITE_OTHER): Payer: 59 | Admitting: Bariatrics

## 2020-07-22 VITALS — BP 108/76 | HR 90 | Temp 98.0°F | Ht 62.0 in | Wt 346.0 lb

## 2020-07-22 DIAGNOSIS — E559 Vitamin D deficiency, unspecified: Secondary | ICD-10-CM | POA: Diagnosis not present

## 2020-07-22 DIAGNOSIS — Z6841 Body Mass Index (BMI) 40.0 and over, adult: Secondary | ICD-10-CM | POA: Diagnosis not present

## 2020-07-22 DIAGNOSIS — Z9189 Other specified personal risk factors, not elsewhere classified: Secondary | ICD-10-CM

## 2020-07-22 DIAGNOSIS — R7303 Prediabetes: Secondary | ICD-10-CM

## 2020-07-22 MED ORDER — VITAMIN D (ERGOCALCIFEROL) 1.25 MG (50000 UNIT) PO CAPS: 50000.0000 [IU] | ORAL_CAPSULE | ORAL | 0 refills | Status: AC

## 2020-07-29 NOTE — Progress Notes (Signed)
Chief Complaint:   OBESITY Michele Ayers is here to discuss her progress with her obesity treatment plan along with follow-up of her obesity related diagnoses. Michele Ayers is on the Category 3 Plan and states she is following her eating plan approximately 50-60% of the time. Michele Ayers states she is doing [place] for 15 minutes 2 times per week.  Today's visit was #: 3 Starting weight: 359 lbs Starting date: 01/27/2020 Today's weight: 346 lbs Today's date: 07/22/2020 Total lbs lost to date: 13 lbs Total lbs lost since last in-office visit: 6 lbs  Interim History: Michele Ayers is down an additional 6 lbs since her last visit. She want to discuss bariatric surgery today.  Subjective:   1. Vitamin D deficiency Michele Ayers is taking Vitamin D.  2. Prediabetes Michele Ayers is taking Glucophage.  3. At risk for dehydration Michele Ayers is at risk for dehydration due weather, obesity and exercise.  Assessment/Plan:   1. Vitamin D deficiency Low Vitamin D level contributes to fatigue and are associated with obesity, breast, and colon cancer.Michele Ayers agrees to continue to take prescription Vitamin D 50,000 IU every week and she will follow-up for routine testing of Vitamin D, at least 2-3 times per year to avoid over-replacement.   2. Prediabetes Gabriana will continue her medications and she will decrease carbohydrates. She will continue to work on weight loss, exercise, and decreasing simple carbohydrates to help decrease the risk of diabetes.    3. At risk for dehydration Michele Ayers was given approximately 15 minutes dehydration prevention counseling today. Michele Ayers is at risk for dehydration due to weight loss and current medication(s). She was encouraged to hydrate and monitor fluid status to avoid dehydration as well as weight loss plateaus.    4. Obesity, current BMI 63.4 Michele Ayers is currently in the action stage of change. As such, her goal is to continue with weight loss efforts. She has agreed to the Category 3 Plan.   Michele Ayers will  continue to meal plan. She will continue intentional eating. She will adhere closely to the plan. We discussed bariatric surgery ( types and risks, and benefits). She was given information for [facility].   Exercise goals:  As is.  Behavioral modification strategies: increasing lean protein intake, decreasing simple carbohydrates, increasing vegetables, increasing water intake, decreasing eating out, no skipping meals, meal planning and cooking strategies, keeping healthy foods in the home, and planning for success.  Michele Ayers has agreed to follow-up with our clinic in 2 weeks. She was informed of the importance of frequent follow-up visits to maximize her success with intensive lifestyle modifications for her multiple health conditions.   Objective:   Blood pressure 108/76, pulse 90, temperature 98 F (36.7 C), height '5\' 2"'$  (1.575 m), weight (!) 346 lb (156.9 kg), SpO2 94 %. Body mass index is 63.28 kg/m.  General: Cooperative, alert, well developed, in no acute distress. HEENT: Conjunctivae and lids unremarkable. Cardiovascular: Regular rhythm.  Lungs: Normal work of breathing. Neurologic: No focal deficits.   Lab Results  Component Value Date   CREATININE 0.50 11/19/2019   BUN 11 11/19/2019   NA 136 11/19/2019   K 4.4 11/19/2019   CL 99 11/19/2019   CO2 24 07/15/2019   Lab Results  Component Value Date   ALT 11 07/15/2019   AST 11 07/15/2019   ALKPHOS 84 07/15/2019   BILITOT 0.4 07/15/2019   Lab Results  Component Value Date   HGBA1C 6.0 (H) 01/27/2020   HGBA1C 6.1 (H) 07/15/2019   HGBA1C 5.8 (  H) 01/03/2019   Lab Results  Component Value Date   INSULIN 27.2 (H) 01/27/2020   Lab Results  Component Value Date   TSH 2.630 01/27/2020   Lab Results  Component Value Date   CHOL 146 01/27/2020   HDL 40 01/27/2020   LDLCALC 90 01/27/2020   TRIG 83 01/27/2020   CHOLHDL 5.1 (H) 07/15/2019   Lab Results  Component Value Date   VD25OH 16.6 (L) 01/27/2020    Lab Results  Component Value Date   WBC 9.0 09/04/2019   HGB 13.9 11/19/2019   HCT 41.0 11/19/2019   MCV 95 09/04/2019   PLT 375 09/04/2019   No results found for: IRON, TIBC, FERRITIN   Lab Results  Component Value Date   CREATININE 0.50 11/19/2019   BUN 11 11/19/2019   NA 136 11/19/2019   K 4.4 11/19/2019   CL 99 11/19/2019   CO2 24 07/15/2019   Lab Results  Component Value Date   ALT 11 07/15/2019   AST 11 07/15/2019   ALKPHOS 84 07/15/2019   BILITOT 0.4 07/15/2019   Lab Results  Component Value Date   HGBA1C 6.0 (H) 01/27/2020   HGBA1C 6.1 (H) 07/15/2019   HGBA1C 5.8 (H) 01/03/2019   Lab Results  Component Value Date   INSULIN 27.2 (H) 01/27/2020   Lab Results  Component Value Date   TSH 2.630 01/27/2020   Attestation Statements:   Reviewed by clinician on day of visit: allergies, medications, problem list, medical history, surgical history, family history, social history, and previous encounter notes.  I, Lizbeth Bark, RMA, am acting as Location manager for [employer], DO.   I have reviewed the above documentation for accuracy and completeness, and I agree with the above. Jearld Lesch, DO

## 2020-07-30 ENCOUNTER — Encounter (INDEPENDENT_AMBULATORY_CARE_PROVIDER_SITE_OTHER): Payer: Self-pay | Admitting: Bariatrics

## 2020-08-06 ENCOUNTER — Ambulatory Visit (INDEPENDENT_AMBULATORY_CARE_PROVIDER_SITE_OTHER): Payer: 59 | Admitting: Bariatrics

## 2020-10-21 ENCOUNTER — Telehealth: Payer: Self-pay | Admitting: Emergency Medicine

## 2020-10-21 DIAGNOSIS — R7611 Nonspecific reaction to tuberculin skin test without active tuberculosis: Secondary | ICD-10-CM

## 2020-10-21 NOTE — Telephone Encounter (Signed)
Patient has a positive pre employment TB screening  Patient is seeking advise

## 2020-10-22 NOTE — Telephone Encounter (Signed)
Needs referral to Infectious Disease group.

## 2020-10-22 NOTE — Telephone Encounter (Signed)
Placed referral to infectious disease due to positive TB test.

## 2020-10-22 NOTE — Telephone Encounter (Signed)
Called and spoke with pt. She states that she had a TB skin test done 10/17/20 and it came back positive. Patient states this is her first postive TB test. Patient dose not show any symptoms of TB. She would like to know what to do next.

## 2020-11-02 ENCOUNTER — Ambulatory Visit
Admission: RE | Admit: 2020-11-02 | Discharge: 2020-11-02 | Disposition: A | Discharge status: Home / Self Care | Payer: 59 | Source: Ambulatory Visit | Attending: Internal Medicine | Admitting: Internal Medicine

## 2020-11-02 ENCOUNTER — Other Ambulatory Visit: Payer: Self-pay

## 2020-11-02 ENCOUNTER — Encounter: Payer: Self-pay | Admitting: Internal Medicine

## 2020-11-02 ENCOUNTER — Ambulatory Visit (INDEPENDENT_AMBULATORY_CARE_PROVIDER_SITE_OTHER): Payer: Self-pay | Admitting: Internal Medicine

## 2020-11-02 VITALS — BP 131/77 | HR 72 | Temp 98.7°F | Wt 344.0 lb

## 2020-11-02 DIAGNOSIS — Z227 Latent tuberculosis: Secondary | ICD-10-CM

## 2020-11-02 MED ORDER — RIFAMPIN 300 MG PO CAPS: 600.0000 mg | ORAL_CAPSULE | Freq: Every day | ORAL | 3 refills | Status: AC

## 2020-11-02 NOTE — Progress Notes (Signed)
[place] for Infectious Disease  Reason for Consult:positive skin ppd test Referring Provider: n/a    Patient Active Problem List   Diagnosis Date Noted   Prediabetes 01/29/2020   Vitamin D deficiency 01/29/2020   Morbid obesity ([place]) 07/15/2019   Abnormal vaginal bleeding 07/15/2019   Perimenopause 07/15/2019   Lymphedema of both lower extremities 01/03/2019   Venous stasis dermatitis 02/22/2013   Sleep apnea 01/23/2013   Hyperlipidemia with target LDL less than 100 06/14/2012   COPD (chronic obstructive pulmonary disease) ([place]) 06/08/2012   Family history of lung cancer 06/08/2012   Former smoker 06/08/2012      HPI: Michele Ayers is a 55 y.o. female with above medical problem referred by PCP for a positive ppd test  Patient had routine annual tb ppd screen that was positive. Last test on 10/15. 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 This year is the first time she tested positive  No b sx, decreased appetite, cough, chest pain No joint pain, headache, dysuria  Patient born in [place], and moved to [place] in 2009.  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 Never been to [place], [place], [place], [place].   No alcohol use Quit smoking No chronic kidney disease or liver disease Does have copd  Patient haven't had a chest xray  Not on any immunosuppressant  Review of Systems: ROS All other ros negative      Past Medical History:  Diagnosis Date   Back pain    Edema of both lower legs    GERD (gastroesophageal reflux disease)    High cholesterol    Joint pain    Lipoma of back    Lymph edema    Prediabetes    SOB (shortness of breath)    Swallowing difficulty     Social History   Tobacco Use   Smoking status: Former    Types: Cigarettes    Quit date: 10/18/2019    Years since quitting:  1.0   Smokeless tobacco: Never   Tobacco comments:    per pt stopped a month ago only smoked a pack in  3 days  Vaping Use   Vaping Use: Never used  Substance Use Topics   Alcohol use: No   Drug use: No    Family History  Problem Relation Age of Onset   COPD Other    Cancer Other    Obesity Mother    High blood pressure Father    High Cholesterol Father    Cancer Father    Obesity Father     No Known Allergies  OBJECTIVE: Vitals:   11/02/20 0846  BP: 131/77  Pulse: 72  Temp: 98.7 F (37.1 C)  TempSrc: Temporal  SpO2: 97%  Weight: (!) 344 lb (156 kg)   Body mass index is 62.92 kg/m.   Physical Exam General/constitutional: obese; no distress, pleasant HEENT: Normocephalic, PER, Conj Clear, EOMI, Oropharynx clear Neck supple CV: rrr no mrg Lungs: clear to auscultation, normal respiratory effort Abd: Soft, Nontender Ext: no edema Skin: No Rash Neuro: nonfocal MSK: no peripheral joint swelling/tenderness/warmth; back spines nontender Psych: alert/oriented  Lab:  Microbiology:  Serology:  Imaging:   Assessment/plan: Problem List Items Addressed This Visit   None Visit Diagnoses     TB lung, latent    -  Primary   Relevant Orders   CBC   Comprehensive metabolic panel   DG Chest 2 View         Discuss natural history of TB exposure/infection Describe intent to treat and tb screening. 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 However, will need cxr Plan 4 months rifampin   Reviewed ddi with patient in particular crestor and provera. She takes crestor for primary prevention and provera for perimenopause sx. It will be ok with rifampin. Discussed with her potential spotting while on rifampin.   Start rifampin when cxr is negative Blood tests today and in 1 month   I spent 60 minute reviewing data/chart, and coordinating care and >50% direct face to face time providing counseling/discussing  diagnostics/treatment plan with patient    Follow-up: Return in about 4 weeks (around 11/30/2020).  Jabier Mutton, [place] for [place] [phone] pager   [phone] cell 11/02/2020, 9:14 AM

## 2020-11-02 NOTE — Patient Instructions (Signed)
You likely have latent TB. 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 If you miss a dose, then just continue taking the next day; do not double up  See me again in 1 month for repeat lab testing to make sure no toxicity   Rifampin can turn your tears/urine red/orange and stain, so avoid wearing contact lenses that are reusable

## 2020-11-03 LAB — COMPREHENSIVE METABOLIC PANEL
AG Ratio: 1.3 (calc) (ref 1.0–2.5)
ALT: 9 U/L (ref 6–29)
AST: 10 U/L (ref 10–35)
Albumin: 3.9 g/dL (ref 3.6–5.1)
Alkaline phosphatase (APISO): 60 U/L (ref 37–153)
BUN: 11 mg/dL (ref 7–25)
CO2: 24 mmol/L (ref 20–32)
Calcium: 8.9 mg/dL (ref 8.6–10.4)
Chloride: 100 mmol/L (ref 98–110)
Creat: 0.72 mg/dL (ref 0.50–1.03)
Globulin: 3.1 g/dL (calc) (ref 1.9–3.7)
Glucose, Bld: 87 mg/dL (ref 65–99)
Potassium: 4.7 mmol/L (ref 3.5–5.3)
Sodium: 135 mmol/L (ref 135–146)
Total Bilirubin: 0.4 mg/dL (ref 0.2–1.2)
Total Protein: 7 g/dL (ref 6.1–8.1)

## 2020-11-03 LAB — CBC
HCT: 42.1 % (ref 35.0–45.0)
Hemoglobin: 14.1 g/dL (ref 11.7–15.5)
MCH: 32 pg (ref 27.0–33.0)
MCHC: 33.5 g/dL (ref 32.0–36.0)
MCV: 95.7 fL (ref 80.0–100.0)
MPV: 11.5 fL (ref 7.5–12.5)
Platelets: 353 10*3/uL (ref 140–400)
RBC: 4.4 10*6/uL (ref 3.80–5.10)
RDW: 12.1 % (ref 11.0–15.0)
WBC: 9.1 10*3/uL (ref 3.8–10.8)

## 2020-11-30 ENCOUNTER — Encounter: Payer: Self-pay | Admitting: Internal Medicine

## 2020-11-30 ENCOUNTER — Other Ambulatory Visit: Payer: Self-pay

## 2020-11-30 ENCOUNTER — Ambulatory Visit (INDEPENDENT_AMBULATORY_CARE_PROVIDER_SITE_OTHER): Payer: Self-pay | Admitting: Internal Medicine

## 2020-11-30 VITALS — BP 125/84 | HR 91 | Temp 98.8°F | Wt 343.2 lb

## 2020-11-30 DIAGNOSIS — Z227 Latent tuberculosis: Secondary | ICD-10-CM

## 2020-11-30 NOTE — Patient Instructions (Signed)
I suspect the rash around your mouth is due to hormonal change that comes with taking rifampin   It doesn't appear to be an adverse drug reaction/allergy at this time. If the mouth develop ulcer or rash involves trunk/arms and itchy, or nausea/abdominal discomfort/vomiting please stop the rifampin and let us know   Continue your rifampin otherwise  Labs today Script for labs to be done in 1 month as well and send to my chart.   No need to make a follow up appointment until end of 01/2021

## 2020-11-30 NOTE — Progress Notes (Signed)
[facility] for Infectious Disease    Patient Active Problem List   Diagnosis Date Noted   Prediabetes 01/29/2020   Vitamin D deficiency 01/29/2020   Morbid obesity ([place]) 07/15/2019   Abnormal vaginal bleeding 07/15/2019   Perimenopause 07/15/2019   Lymphedema of both lower extremities 01/03/2019   Venous stasis dermatitis 02/22/2013   Sleep apnea 01/23/2013   Hyperlipidemia with target LDL less than 100 06/14/2012   COPD (chronic obstructive pulmonary disease) ([place]) 06/08/2012   Family history of lung cancer 06/08/2012   Former smoker 06/08/2012    Cc: fu latent tb treatment  HPI: Michele Ayers is a 55 y.o. female with above medical problem referred by PCP for a positive ppd test, and here today for ltbi treatment f/u   11/30/20 id f/u Patient doing well. Tolerating rifampin no n/v/diarrhea/abd pain/decreased appetite She developed a nonitchy acne like rash around her mouth since starting the drug. No other rash. Feels well otherwise   Saw her end of 10/2020: ------------  Patient had routine annual tb ppd screen that was positive. Last test on 10/15. 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 This year is the first time she tested positive  No b sx, decreased appetite, cough, chest pain No joint pain, headache, dysuria  Patient born in [place], and moved to [place] in 2009.  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 Never been to [place], [place], [place], [place].   No alcohol use Quit smoking No chronic kidney disease or liver disease Does have copd  Patient haven't had a chest xray  Not on any immunosuppressant  Review of Systems: ROS All other ros negative      Past Medical History:  Diagnosis Date   Back pain    Edema of both lower legs    GERD (gastroesophageal reflux disease)    High  cholesterol    Joint pain    Lipoma of back    Lymph edema    Prediabetes    SOB (shortness of breath)    Swallowing difficulty     Social History   Tobacco Use   Smoking status: Former    Types: Cigarettes    Quit date: 10/18/2019    Years since quitting: 1.1   Smokeless tobacco: Never   Tobacco comments:    per pt stopped a month ago only smoked a pack in  3 days  Vaping Use   Vaping Use: Never used  Substance Use Topics   Alcohol use: No   Drug use: No    Family History  Problem Relation Age of Onset   COPD Other    Cancer Other    Obesity Mother    High blood pressure Father    High Cholesterol Father    Cancer Father    Obesity Father     No Known Allergies  OBJECTIVE: Vitals:   11/30/20 0833  BP: 125/84  Pulse: 91  Temp: 98.8 F (37.1 C)  TempSrc: Temporal  SpO2: 95%  Weight: (!) 343 lb 3.2 oz (155.7 kg)   Body mass index is 62.77 kg/m.   Physical Exam General/constitutional: no distress, pleasant HEENT: Normocephalic, PER, Conj Clear, EOMI, Oropharynx clear Neck supple CV: rrr no mrg Lungs: clear to auscultation, normal respiratory effort Abd: Soft, Nontender Ext: no edema Skin: small erythematous papular rash  around mouth nontender and no pustules; no oral lesion; no truncal/ext lesions Neuro: nonfocal MSK: no peripheral joint swelling/tenderness/warmth; back spines nontender    Lab: Lab Results  Component Value Date   WBC 9.1 11/02/2020   HGB 14.1 11/02/2020   HCT 42.1 11/02/2020   MCV 95.7 11/02/2020   PLT 353 29/92/4268   Last metabolic panel Lab Results  Component Value Date   GLUCOSE 87 11/02/2020   NA 135 11/02/2020   K 4.7 11/02/2020   CL 100 11/02/2020   CO2 24 11/02/2020   BUN 11 11/02/2020   CREATININE 0.72 11/02/2020   GFRNONAA 103 07/15/2019   CALCIUM 8.9 11/02/2020   PROT 7.0 11/02/2020   ALBUMIN 3.9 07/15/2019   LABGLOB 2.8 07/15/2019   AGRATIO 1.4 07/15/2019   BILITOT 0.4 11/02/2020   ALKPHOS 84  07/15/2019   AST 10 11/02/2020   ALT 9 11/02/2020   ANIONGAP 8 06/11/2015    Microbiology:  Serology:  Imaging: DG Chest 2 View CLINICAL DATA:  55 year old female with positive TB screening test  EXAM: CHEST - 2 VIEW  COMPARISON:  03/23/2015  FINDINGS: Cardiomediastinal silhouette unchanged in size and contour. No evidence of central vascular congestion. No interlobular septal thickening.  No pneumothorax or pleural effusion. Coarsened interstitial markings, with no confluent airspace disease.  No acute displaced fracture. Degenerative changes of the spine.  IMPRESSION: No active cardiopulmonary disease.  Electronically Signed   By: Corrie Mckusick D.O.   On: 11/02/2020 10:09   Assessment/plan: Problem List Items Addressed This Visit   None Visit Diagnoses     TB lung, latent    -  Primary   Relevant Orders   CBC   Comprehensive metabolic panel        Discuss natural history of TB exposure/infection Describe intent to treat and tb screening. 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 However, will need cxr Plan 4 months rifampin   Reviewed ddi with patient in particular crestor and provera. She takes crestor for primary prevention and provera for perimenopause sx. It will be ok with rifampin. Discussed with her potential spotting while on rifampin.   -------------- Patient started rifmapin day1=10/31; planned 4 months to end 03/02/2021 Rash around mouth suspect acne type with possible hormonal change due to taking rifampin. She was on depo provera for menopausal sx currently prior to taking rifampin. Doubt perioral dermatitis. Rash not suspicious for allergy type reaction.   -labs today and script for same in a month; to send mychart to me -f/u 2 months -f/u sooner if n/v/diarrhea/abd pain, joint pain, worsening rash to trunk/mouth/extremities   Follow-up: Return in about 2 months (around 01/30/2021).  Jabier Mutton, [place] for [place] [phone] pager   [phone] cell 11/30/2020, 8:59 AM

## 2020-12-20 ENCOUNTER — Other Ambulatory Visit: Payer: Self-pay | Admitting: Obstetrics & Gynecology

## 2020-12-20 DIAGNOSIS — N938 Other specified abnormal uterine and vaginal bleeding: Secondary | ICD-10-CM

## 2020-12-20 DIAGNOSIS — Z01419 Encounter for gynecological examination (general) (routine) without abnormal findings: Secondary | ICD-10-CM

## 2020-12-20 DIAGNOSIS — N951 Menopausal and female climacteric states: Secondary | ICD-10-CM

## 2021-01-10 ENCOUNTER — Other Ambulatory Visit: Payer: Self-pay | Admitting: Emergency Medicine

## 2021-01-10 DIAGNOSIS — E785 Hyperlipidemia, unspecified: Secondary | ICD-10-CM

## 2021-01-10 DIAGNOSIS — R7303 Prediabetes: Secondary | ICD-10-CM

## 2021-02-10 ENCOUNTER — Telehealth: Payer: Self-pay

## 2021-02-10 DIAGNOSIS — I890 Lymphedema, not elsewhere classified: Secondary | ICD-10-CM

## 2021-02-10 MED ORDER — FUROSEMIDE 20 MG PO TABS: 40.0000 mg | ORAL_TABLET | Freq: Every day | ORAL | 0 refills | Status: DC

## 2021-02-10 NOTE — Telephone Encounter (Signed)
Refilled sent to patient pharmacy Furosemide.

## 2021-02-10 NOTE — Telephone Encounter (Signed)
Pt is requesting a refill on: furosemide (LASIX) 20 MG tablet (Expired)  Pharmacy: [id number] - [place], [place].  LOV 07/07/20  Pt Cb [phone]

## 2021-03-19 ENCOUNTER — Telehealth: Payer: Self-pay | Admitting: Emergency Medicine

## 2021-03-19 NOTE — Telephone Encounter (Signed)
Pt state she has an ammonia smell when urinating ? ?Offered patient an aapt, pt declined stating she has TB, pt requested to apeak to cma, cma was unavailable ? ?Pt requesting a cb ? ? ?

## 2021-03-22 NOTE — Telephone Encounter (Signed)
Pt checking status of return call ? ?Please call pt ?

## 2021-03-22 NOTE — Telephone Encounter (Signed)
Recommendation is to touch bases with infectious disease doctor who started treatment for TB.  This is most likely secondary to medications.

## 2021-03-23 NOTE — Telephone Encounter (Signed)
Called patient to inform her of provider recommendation. Patient verbalize understanding. No further questions  ?

## 2021-04-05 ENCOUNTER — Other Ambulatory Visit: Payer: Self-pay | Admitting: Internal Medicine

## 2021-04-05 NOTE — Telephone Encounter (Signed)
Please advise on refill request

## 2021-04-08 ENCOUNTER — Other Ambulatory Visit: Payer: Self-pay

## 2021-04-08 ENCOUNTER — Encounter: Payer: Self-pay | Admitting: Internal Medicine

## 2021-04-08 ENCOUNTER — Ambulatory Visit (INDEPENDENT_AMBULATORY_CARE_PROVIDER_SITE_OTHER): Payer: 59 | Admitting: Internal Medicine

## 2021-04-08 VITALS — BP 127/81 | HR 89 | Temp 98.4°F | Wt 360.4 lb

## 2021-04-08 DIAGNOSIS — Z227 Latent tuberculosis: Secondary | ICD-10-CM | POA: Diagnosis not present

## 2021-04-08 DIAGNOSIS — R0602 Shortness of breath: Secondary | ICD-10-CM | POA: Diagnosis not present

## 2021-04-08 DIAGNOSIS — R609 Edema, unspecified: Secondary | ICD-10-CM

## 2021-04-08 NOTE — Patient Instructions (Addendum)
You have completed 4 months (11/03/2020 - 03/2021) at least of rifampin for treatment of latent TB. This is the current recommended course. Taking longer than this has not further beneficial effect ? ?There is still a very very small chance that you still could develop active TB (treatment of latent TB reduce this chance but can not completely eliminate it). This doesn't mean that you have to treat latent tb again ? ?What you need to monitor from now on: ?Chronic cough, fever, chill, nightsweat, weight loss ("consumption of the body") then let your workplace know ?You might also need annual routine chest xray as part of ongoing monitoring ? ? ? ? ?------------- ?Other issues discussed today: ?Swelling in legs which seem chronic ?New short of breath ? ?-will check your kidney and liver to make sure they are functioning well. If they are, you should definitely discuss evaluation for your heart and lung function.  ? ? ?As for urine smell, this is not an indicator of uti or medication side effect. Just drink enough fluid to keep good urine output. ? ?You can follow up with your regular doctor for these issues. ?

## 2021-04-08 NOTE — Progress Notes (Signed)
?  ? ? ? ? ?[facility] ? ? ? ?Patient Active Problem List  ? Diagnosis Date Noted  ? Prediabetes 01/29/2020  ? Vitamin D deficiency 01/29/2020  ? Morbid obesity ([place]) 07/15/2019  ? Abnormal vaginal bleeding 07/15/2019  ? Perimenopause 07/15/2019  ? Lymphedema of both lower extremities 01/03/2019  ? Venous stasis dermatitis 02/22/2013  ? Sleep apnea 01/23/2013  ? Hyperlipidemia with target LDL less than 100 06/14/2012  ? COPD (chronic obstructive pulmonary disease) ([place]) 06/08/2012  ? Family history of lung cancer 06/08/2012  ? Former smoker 06/08/2012  ? ? ?Cc: fu latent tb treatment ? ?HPI: Michele Ayers is a 56 y.o. female with above medical problem referred by PCP for a positive ppd test, and here today for ltbi treatment f/u ? ?04/08/2021 id f/u ?Patient is here today stating she still is taking rifampin. She refilled at the beginning of march ?No n/v, itch, yellowing of her eyes ?No smoking but is around people who smoke (her clients/mother/kids) - every day exposed - 10 to 15 years ?Also have legs swelling for several years (given lasix), lately not going down when legs propped up, and new sob started 2 weeks prior to this visit. When she walks to her truck from home she feels like she needs to catch a second air. Sob when she lays flat and better when she sits up. No PND or night cough.  ?Patient without known hx of heart failure ?No cough ?No known copd ? ?No myalgia/arthralgia/rash ?No fever, chill ?No nightsweat ?  ?11/30/20 id f/u ?Patient doing well. Tolerating rifampin no n/v/diarrhea/abd pain/decreased appetite ?She developed a nonitchy acne like rash around her mouth since starting the drug. No other rash. ?Feels well otherwise ? ? ?Saw her end of 10/2020: ?------------ ? ?Patient had routine annual tb ppd screen that was positive. Last test on 10/15. She reports the size of induration was more than 10 mm ? 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 This year is the first time she tested positive ? ?No b sx, decreased appetite, cough, chest pain ?No joint pain, headache, dysuria ? ?Patient born in [place], and moved to [place] in 2009.  ?No specific tb exposure except working in the health care system as mentioned. Never been to [place], [place], [place], [place].  ? ?No alcohol use ?Quit smoking ?No chronic kidney disease or liver disease ?Does have copd ? ?Patient haven't had a chest xray ? ?Not on any immunosuppressant ? ?Review of Systems: ?ROS ?All other ros negative ? ? ? ? ? ?Past Medical History:  ?Diagnosis Date  ? Back pain   ? Edema of both lower legs   ? GERD (gastroesophageal reflux disease)   ? High cholesterol   ? Joint pain   ? Lipoma of back   ? Lymph edema   ? Prediabetes   ? SOB (shortness of breath)   ? Swallowing difficulty   ? ? ?Social History  ? ?Tobacco Use  ? Smoking status: Former  ?  Types: Cigarettes  ?  Quit date: 10/18/2019  ?  Years since quitting: 1.4  ? Smokeless tobacco: Never  ? Tobacco comments:  ?  per pt stopped a month ago only smoked a pack in  3 days  ?Vaping Use  ? Vaping Use: Never used  ?Substance Use Topics  ? Alcohol use: No  ? Drug use: No  ? ? ?Family History  ?Problem Relation  Age of Onset  ? COPD Other   ? Cancer Other   ? Obesity Mother   ? High blood pressure Father   ? High Cholesterol Father   ? Cancer Father   ? Obesity Father   ? ? ?No Known Allergies ? ?OBJECTIVE: ?Vitals:  ? 04/08/21 0928  ?Weight: (!) 360 lb 6.4 oz (163.5 kg)  ? ?Body mass index is 65.92 kg/m?. ? ? ?Physical Exam ?General/constitutional: no distress, pleasant, obese ?HEENT: Normocephalic, PER, Conj Clear, EOMI, Oropharynx clear ?Neck supple ?CV: rrr no mrg ?Lungs: clear to auscultation, normal respiratory effort ?Abd: Soft, Nontender ?Ext: 3+ edema to thigh bilaterally ?Skin: no rash ?Neuro: nonfocal ? ? ? ? ? ?Lab: ?Lab Results  ?Component Value Date  ? WBC 9.1 11/02/2020  ? HGB 14.1 11/02/2020  ? HCT 42.1  11/02/2020  ? MCV 95.7 11/02/2020  ? PLT 353 11/02/2020  ? ?Last metabolic panel ?Lab Results  ?Component Value Date  ? GLUCOSE 87 11/02/2020  ? NA 135 11/02/2020  ? K 4.7 11/02/2020  ? CL 100 11/02/2020  ? CO2 24 11/02/2020  ? BUN 11 11/02/2020  ? CREATININE 0.72 11/02/2020  ? GFRNONAA 103 07/15/2019  ? CALCIUM 8.9 11/02/2020  ? PROT 7.0 11/02/2020  ? ALBUMIN 3.9 07/15/2019  ? LABGLOB 2.8 07/15/2019  ? AGRATIO 1.4 07/15/2019  ? BILITOT 0.4 11/02/2020  ? ALKPHOS 84 07/15/2019  ? AST 10 11/02/2020  ? ALT 9 11/02/2020  ? ANIONGAP 8 06/11/2015  ? ? ?Microbiology: ? ?Serology: ? ?Imaging: ?DG Chest 2 View ?CLINICAL DATA:  56 year old female with positive TB screening test ? ?EXAM: ?CHEST - 2 VIEW ? ?COMPARISON:  03/23/2015 ? ?FINDINGS: ?Cardiomediastinal silhouette unchanged in size and contour. No ?evidence of central vascular congestion. No interlobular septal ?thickening. ? ?No pneumothorax or pleural effusion. Coarsened interstitial ?markings, with no confluent airspace disease. ? ?No acute displaced fracture. Degenerative changes of the spine. ? ?IMPRESSION: ?No active cardiopulmonary disease. ? ?Electronically Signed ?  By: Corrie Mckusick D.O. ?  On: 11/02/2020 10:09 ? ? ?Assessment/plan: ?Problem List Items Addressed This Visit   ?None ?Visit Diagnoses   ? ? TB lung, latent    -  Primary  ? Relevant Orders  ? CBC  ? COMPLETE METABOLIC PANEL WITH GFR  ? Urinalysis  ? Protein / creatinine ratio, urine  ? Edema, unspecified type      ? Relevant Orders  ? CBC  ? COMPLETE METABOLIC PANEL WITH GFR  ? Urinalysis  ? Protein / creatinine ratio, urine  ? Shortness of breath      ? Relevant Orders  ? CBC  ? COMPLETE METABOLIC PANEL WITH GFR  ? Urinalysis  ? Protein / creatinine ratio, urine  ? DG Chest 2 View  ? ?  ? ? ?Discuss natural history of TB exposure/infection ?Describe intent to treat and tb screening. 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 ?No b sx or sign of active tb. However, will need  cxr ?Plan 4 months rifampin  ? ?Reviewed ddi with patient in particular crestor and provera. She takes crestor for primary prevention and provera for perimenopause sx. It will be ok with rifampin. Discussed with her potential spotting while on rifampin.  ? ?-------------- ?Patient started rifmapin day1=10/31; planned 4 months to end 03/02/2021 ?Rash around mouth suspect acne type with possible hormonal change due to taking rifampin. She was on depo provera for menopausal sx currently prior to taking rifampin. Doubt perioral dermatitis.  Rash not suspicious for allergy type reaction.  ? ? ?04/08/2021 ?Patient actually took an extra month of rifmapin ?Discussed with her she finished latent tb tx, but will need to continue monitoring for sign of active TB ? ?She relate chronic edema, new sob. While I do not think this is worsened/created by rifmapin, will check lft/creatinine and hemoglobin and cxr, to assess for potential causes of edema and sob. But suspect she should discuss with pcp to workup chf/copd (2nd smoke exposure chronically) ?She also relate smelly urine I discussed this is not uti, and encourage good balance fluid intake ?She will need to have ongoing discussion with her pcp about these issues ? ?-cbc, cmp, cxr ?-will discuss with her if labs concerning ?-follow up with pcp ?-no need to have scheduled id follow up at this time ? ? ?Follow-up: Return if symptoms worsen or fail to improve. ? ?Jabier Mutton, MD ?[facility] ?[place] ?[phone] pager   [phone] cell ?04/08/2021, 9:31 AM ? ?

## 2021-04-09 LAB — CBC
HCT: 40.2 % (ref 35.0–45.0)
Hemoglobin: 13.3 g/dL (ref 11.7–15.5)
MCH: 31.3 pg (ref 27.0–33.0)
MCHC: 33.1 g/dL (ref 32.0–36.0)
MCV: 94.6 fL (ref 80.0–100.0)
MPV: 10.6 fL (ref 7.5–12.5)
Platelets: 343 10*3/uL (ref 140–400)
RBC: 4.25 10*6/uL (ref 3.80–5.10)
RDW: 12.7 % (ref 11.0–15.0)
WBC: 8.7 10*3/uL (ref 3.8–10.8)

## 2021-04-09 LAB — COMPLETE METABOLIC PANEL WITH GFR
AG Ratio: 1.1 (calc) (ref 1.0–2.5)
ALT: 9 U/L (ref 6–29)
AST: 9 U/L — ABNORMAL LOW (ref 10–35)
Albumin: 3.9 g/dL (ref 3.6–5.1)
Alkaline phosphatase (APISO): 76 U/L (ref 37–153)
BUN: 14 mg/dL (ref 7–25)
CO2: 27 mmol/L (ref 20–32)
Calcium: 9.1 mg/dL (ref 8.6–10.4)
Chloride: 98 mmol/L (ref 98–110)
Creat: 0.8 mg/dL (ref 0.50–1.03)
Globulin: 3.7 g/dL (calc) (ref 1.9–3.7)
Glucose, Bld: 111 mg/dL — ABNORMAL HIGH (ref 65–99)
Potassium: 4.8 mmol/L (ref 3.5–5.3)
Sodium: 136 mmol/L (ref 135–146)
Total Bilirubin: 0.3 mg/dL (ref 0.2–1.2)
Total Protein: 7.6 g/dL (ref 6.1–8.1)
eGFR: 88 mL/min/{1.73_m2} (ref >=60)

## 2021-04-09 LAB — URINALYSIS
Bilirubin Urine: NEGATIVE
Glucose, UA: NEGATIVE
Hgb urine dipstick: NEGATIVE
Ketones, ur: NEGATIVE
Leukocytes,Ua: NEGATIVE
Nitrite: NEGATIVE
Protein, ur: NEGATIVE
Specific Gravity, Urine: 1.012 (ref 1.001–1.035)
pH: 6.5 (ref 5.0–8.0)

## 2021-04-09 LAB — PROTEIN / CREATININE RATIO, URINE
Creatinine, Urine: 49 mg/dL (ref 20–275)
Protein/Creat Ratio: 102 mg/g creat (ref 24–184)
Protein/Creatinine Ratio: 0.102 mg/mg creat (ref 0.024–0.184)
Total Protein, Urine: 5 mg/dL (ref 5–24)

## 2021-04-28 ENCOUNTER — Other Ambulatory Visit: Payer: Self-pay | Admitting: Emergency Medicine

## 2021-04-28 DIAGNOSIS — I890 Lymphedema, not elsewhere classified: Secondary | ICD-10-CM

## 2021-07-23 ENCOUNTER — Other Ambulatory Visit: Payer: Self-pay | Admitting: Emergency Medicine

## 2021-07-23 DIAGNOSIS — I890 Lymphedema, not elsewhere classified: Secondary | ICD-10-CM

## 2021-08-11 ENCOUNTER — Encounter (INDEPENDENT_AMBULATORY_CARE_PROVIDER_SITE_OTHER): Payer: Self-pay

## 2021-08-17 ENCOUNTER — Other Ambulatory Visit: Payer: Self-pay | Admitting: Emergency Medicine

## 2021-08-17 DIAGNOSIS — I890 Lymphedema, not elsewhere classified: Secondary | ICD-10-CM

## 2021-09-09 ENCOUNTER — Other Ambulatory Visit: Payer: Self-pay | Admitting: Emergency Medicine

## 2021-09-09 DIAGNOSIS — I890 Lymphedema, not elsewhere classified: Secondary | ICD-10-CM

## 2021-10-08 ENCOUNTER — Other Ambulatory Visit: Payer: Self-pay | Admitting: Emergency Medicine

## 2021-10-08 DIAGNOSIS — I890 Lymphedema, not elsewhere classified: Secondary | ICD-10-CM

## 2021-10-18 ENCOUNTER — Other Ambulatory Visit: Payer: Self-pay | Admitting: Emergency Medicine

## 2021-10-18 DIAGNOSIS — I890 Lymphedema, not elsewhere classified: Secondary | ICD-10-CM

## 2021-11-13 ENCOUNTER — Other Ambulatory Visit: Payer: Self-pay | Admitting: Emergency Medicine

## 2021-11-13 DIAGNOSIS — I890 Lymphedema, not elsewhere classified: Secondary | ICD-10-CM

## 2021-12-07 ENCOUNTER — Other Ambulatory Visit: Payer: Self-pay | Admitting: Emergency Medicine

## 2021-12-07 ENCOUNTER — Telehealth: Payer: Self-pay | Admitting: *Deleted

## 2021-12-07 ENCOUNTER — Ambulatory Visit (INDEPENDENT_AMBULATORY_CARE_PROVIDER_SITE_OTHER): Payer: No Typology Code available for payment source | Admitting: Emergency Medicine

## 2021-12-07 ENCOUNTER — Encounter: Payer: Self-pay | Admitting: Emergency Medicine

## 2021-12-07 VITALS — BP 132/88 | HR 93 | Temp 98.5°F | Ht 62.0 in | Wt 337.0 lb

## 2021-12-07 DIAGNOSIS — Z1231 Encounter for screening mammogram for malignant neoplasm of breast: Secondary | ICD-10-CM

## 2021-12-07 DIAGNOSIS — Z114 Encounter for screening for human immunodeficiency virus [HIV]: Secondary | ICD-10-CM

## 2021-12-07 DIAGNOSIS — Z1159 Encounter for screening for other viral diseases: Secondary | ICD-10-CM

## 2021-12-07 DIAGNOSIS — Z23 Encounter for immunization: Secondary | ICD-10-CM | POA: Diagnosis not present

## 2021-12-07 DIAGNOSIS — I890 Lymphedema, not elsewhere classified: Secondary | ICD-10-CM

## 2021-12-07 DIAGNOSIS — R7303 Prediabetes: Secondary | ICD-10-CM

## 2021-12-07 DIAGNOSIS — Z13228 Encounter for screening for other metabolic disorders: Secondary | ICD-10-CM

## 2021-12-07 DIAGNOSIS — Z1329 Encounter for screening for other suspected endocrine disorder: Secondary | ICD-10-CM

## 2021-12-07 DIAGNOSIS — Z13220 Encounter for screening for lipoid disorders: Secondary | ICD-10-CM

## 2021-12-07 DIAGNOSIS — Z130 Encounter for screening for diseases of the blood and blood-forming organs and certain disorders involving the immune mechanism: Secondary | ICD-10-CM

## 2021-12-07 DIAGNOSIS — Z0001 Encounter for general adult medical examination with abnormal findings: Secondary | ICD-10-CM

## 2021-12-07 MED ORDER — WEGOVY 0.5 MG/0.5ML ~~LOC~~ SOAJ: 0.5000 mg | SUBCUTANEOUS | 5 refills | Status: DC

## 2021-12-07 NOTE — Telephone Encounter (Signed)
Thank you :)

## 2021-12-07 NOTE — Patient Instructions (Signed)

## 2021-12-07 NOTE — Telephone Encounter (Signed)
Called patient to inform her that her letter was ready for pick up, Letter at the front desk

## 2021-12-07 NOTE — Assessment & Plan Note (Signed)
Stable.  Continues Lasix 40 mg daily.

## 2021-12-07 NOTE — Assessment & Plan Note (Signed)
Diet and nutrition discussed.  Advised to decrease amount of daily carbohydrate intake and daily calories and increase amount of plant based protein in her diet. Has tried medical weight management clinic before without significant results. Wants to try injectable GLP-1's. Recommend to start Wegovy 0.5 mg weekly.  Will increase dose every 2 weeks as tolerated.

## 2021-12-07 NOTE — Progress Notes (Signed)
Michele Ayers 56 y.o.   Chief Complaint  Patient presents with   Annual Exam    Patient states she is going out of the country and needs a provider note, weight gain issues     HISTORY OF PRESENT ILLNESS: This is a 55 y.o. female here for annual exam. Still struggling with weight gain issues.  Inquiring about semaglutide.  HPI   Prior to Admission medications   Medication Sig Start Date End Date Taking? Authorizing Provider  aspirin EC 81 MG tablet Take 81 mg by mouth daily. Swallow whole.   Yes [provider]  furosemide (LASIX) 40 MG tablet Take 40 mg by mouth daily. 02/10/21  Yes [provider]  furosemide (LASIX) 40 MG tablet TAKE 1 TABLET BY MOUTH DAILY. OVERDUE FOR ANNUAL APPT MUST SEE PROVIDER FOR FUTURE REFILLS 11/13/21  Yes Azai Gaffin, Michele Bloomer, MD  ibuprofen (ADVIL) 600 MG tablet Take 1 tablet (600 mg total) by mouth every 6 (six) hours as needed. 11/19/19  Yes [name], MD  metFORMIN (GLUCOPHAGE) 1000 MG tablet TAKE 1 TABLET (1,000 MG TOTAL) BY MOUTH 2 (TWO) TIMES DAILY WITH A MEAL. 01/11/21  Yes Theus Espin, Michele Bloomer, MD  rosuvastatin (CRESTOR) 10 MG tablet TAKE 1 TABLET BY MOUTH EVERY DAY 01/11/21  Yes Murice Barbar, Michele Bloomer, MD  Vitamin D, Ergocalciferol, (DRISDOL) 1.25 MG (50000 UNIT) CAPS capsule Take 1 capsule (50,000 Units total) by mouth every 7 (seven) days. 07/22/20  Yes [name], DO    No Known Allergies  Patient Active Problem List   Diagnosis Date Noted   Prediabetes 01/29/2020   Vitamin D deficiency 01/29/2020   Morbid obesity ([place]) 07/15/2019   Abnormal vaginal bleeding 07/15/2019   Perimenopause 07/15/2019   Lymphedema of both lower extremities 01/03/2019   Venous stasis dermatitis 02/22/2013   Sleep apnea 01/23/2013   Hyperlipidemia with target LDL less than 100 06/14/2012   COPD (chronic obstructive pulmonary disease) ([place]) 06/08/2012   Family history of lung cancer 06/08/2012   Former smoker 06/08/2012    Past  Medical History:  Diagnosis Date   Back pain    Edema of both lower legs    GERD (gastroesophageal reflux disease)    High cholesterol    Joint pain    Lipoma of back    Lymph edema    Prediabetes    SOB (shortness of breath)    Swallowing difficulty     Past Surgical History:  Procedure Laterality Date   HYSTEROSCOPY WITH D & C N/A 11/19/2019   Procedure: DILATATION AND CURETTAGE /HYSTEROSCOPY;  Surgeon: [name], MD;  Location: [facility];  Service: Gynecology;  Laterality: N/A;   TUBAL LIGATION      Social History   Socioeconomic History   Marital status: Single    Spouse name: Not on file   Number of children: Not on file   Years of education: Not on file   Highest education level: Not on file  Occupational History   Not on file  Tobacco Use   Smoking status: Former    Types: Cigarettes    Quit date: 10/18/2019    Years since quitting: 2.1   Smokeless tobacco: Never   Tobacco comments:    per pt stopped a month ago only smoked a pack in  3 days  Vaping Use   Vaping Use: Never used  Substance and Sexual Activity   Alcohol use: No   Drug use: No   Sexual activity: Not Currently  Birth control/protection: Surgical  Other Topics Concern   Not on file  Social History Narrative   Not on file   Social Determinants of Health   Financial Resource Strain: Not on file  Food Insecurity: Food Insecurity Present (12/16/2019)   Hunger Vital Sign    Worried About Running Out of Food in the Last Year: Never true    Ran Out of Food in the Last Year: Sometimes true  Transportation Needs: No Transportation Needs (12/16/2019)   PRAPARE - Hydrologist (Medical): No    Lack of Transportation (Non-Medical): No  Physical Activity: Not on file  Stress: Not on file  Social Connections: Not on file  Intimate Partner Violence: Not on file    Family History  Problem Relation Age of Onset   COPD Other    Cancer Other    Obesity Mother     High blood pressure Father    High Cholesterol Father    Cancer Father    Obesity Father      Review of Systems  Constitutional: Negative.  Negative for chills and fever.  HENT: Negative.  Negative for congestion and sore throat.   Respiratory: Negative.  Negative for cough and shortness of breath.   Cardiovascular: Negative.  Negative for chest pain and palpitations.  Gastrointestinal: Negative.  Negative for abdominal pain, diarrhea, nausea and vomiting.  Genitourinary: Negative.  Negative for dysuria and hematuria.  Skin: Negative.  Negative for rash.  Neurological: Negative.  Negative for dizziness and headaches.  All other systems reviewed and are negative.  Today's Vitals   12/07/21 0940  BP: 132/88  Pulse: 93  Temp: 98.5 F (36.9 C)  TempSrc: Oral  SpO2: 96%  Weight: (!) 337 lb (152.9 kg)  Height: '5\' 2"'$  (1.575 m)   Body mass index is 61.64 kg/m.   Physical Exam Vitals reviewed.  Constitutional:      Appearance: Normal appearance. She is obese.  HENT:     Head: Normocephalic.     Right Ear: Tympanic membrane, ear canal and external ear normal.     Left Ear: Tympanic membrane, ear canal and external ear normal.     Mouth/Throat:     Mouth: Mucous membranes are moist.     Pharynx: Oropharynx is clear.  Eyes:     Extraocular Movements: Extraocular movements intact.     Conjunctiva/sclera: Conjunctivae normal.     Pupils: Pupils are equal, round, and reactive to light.  Cardiovascular:     Rate and Rhythm: Normal rate and regular rhythm.     Pulses: Normal pulses.     Heart sounds: Normal heart sounds.  Pulmonary:     Effort: Pulmonary effort is normal.     Breath sounds: Normal breath sounds.  Abdominal:     Palpations: Abdomen is soft.     Tenderness: There is no abdominal tenderness.  Musculoskeletal:     Cervical back: No tenderness.     Right lower leg: Edema present.     Left lower leg: Edema present.  Lymphadenopathy:     Cervical: No cervical  adenopathy.  Skin:    General: Skin is warm and dry.  Neurological:     General: No focal deficit present.     Mental Status: She is alert and oriented to person, place, and time.  Psychiatric:        Mood and Affect: Mood normal.        Behavior: Behavior normal.  ASSESSMENT & PLAN: Problem List Items Addressed This Visit       Other   Lymphedema of both lower extremities    Stable.  Continues Lasix 40 mg daily.      Morbid obesity ([place])    Diet and nutrition discussed.  Advised to decrease amount of daily carbohydrate intake and daily calories and increase amount of plant based protein in her diet. Has tried medical weight management clinic before without significant results. Wants to try injectable GLP-1's. Recommend to start Wegovy 0.5 mg weekly.  Will increase dose every 2 weeks as tolerated.      Relevant Medications   Semaglutide-Weight Management (WEGOVY) 0.5 MG/0.5ML SOAJ   Other Relevant Orders   CBC with Differential   Comprehensive metabolic panel   Lipid panel   TSH   Prediabetes    Diet and nutrition discussed. Hemoglobin A1c done today. Continue metformin 1000 mg twice a day      Relevant Orders   Hemoglobin A1c   Other Visit Diagnoses     Encounter for general adult medical examination with abnormal findings    -  Primary   Need for vaccination       Relevant Orders   Flu Vaccine QUAD 6+ mos PF IM (Fluarix Quad PF) (Completed)   Zoster Recombinant (Shingrix )   Encounter for screening mammogram for malignant neoplasm of breast       Relevant Medications   Semaglutide-Weight Management (WEGOVY) 0.5 MG/0.5ML SOAJ   Other Relevant Orders   MM Digital Screening   Screening for HIV (human immunodeficiency virus)       Relevant Orders   HIV antibody   Need for hepatitis C screening test       Relevant Orders   Hepatitis C antibody screen   Screening for deficiency anemia       Relevant Orders   CBC with Differential   Screening for  lipoid disorders       Relevant Orders   Lipid panel   Screening for endocrine, metabolic and immunity disorder       Relevant Orders   Comprehensive metabolic panel      Modifiable risk factors discussed with patient. Anticipatory guidance according to age provided. The following topics were also discussed: Social Determinants of Health Smoking.  Non-smoker.  Former smoker Diet and nutrition and need to decrease amount of daily carbohydrate intake and daily calories and increase amount of plant based protein in her diet Benefits of exercise Cancer screening and need for breast cancer screening with mammogram.  Cologuard negative in 2021. Vaccinations reviewed and recommendations Cardiovascular risk assessment The 10-year ASCVD risk score (Arnett DK, et al., 2019) is: 1.9%   Values used to calculate the score:     Age: 36 years     Sex: Female     Is Non-Hispanic African American: No     Diabetic: No     Tobacco smoker: No     Systolic Blood Pressure: 417 mmHg     Is BP treated: No     HDL Cholesterol: 40 mg/dL     Total Cholesterol: 146 mg/dL Review of all medications Mental health including depression and anxiety Fall and accident prevention  Patient Instructions  Health Maintenance, Female Adopting a healthy lifestyle and getting preventive care are important in promoting health and wellness. Ask your health care provider about: The right schedule for you to have regular tests and exams. Things you can do on your own to prevent diseases and  keep yourself healthy. What should I know about diet, weight, and exercise? Eat a healthy diet  Eat a diet that includes plenty of vegetables, fruits, low-fat dairy products, and lean protein. Do not eat a lot of foods that are high in solid fats, added sugars, or sodium. Maintain a healthy weight Body mass index (BMI) is used to identify weight problems. It estimates body fat based on height and weight. Your health care provider  can help determine your BMI and help you achieve or maintain a healthy weight. Get regular exercise Get regular exercise. This is one of the most important things you can do for your health. Most adults should: Exercise for at least 150 minutes each week. The exercise should increase your heart rate and make you sweat (moderate-intensity exercise). Do strengthening exercises at least twice a week. This is in addition to the moderate-intensity exercise. Spend less time sitting. Even light physical activity can be beneficial. Watch cholesterol and blood lipids Have your blood tested for lipids and cholesterol at 54 years of age, then have this test every 5 years. Have your cholesterol levels checked more often if: Your lipid or cholesterol levels are high. You are older than 54 years of age. You are at high risk for heart disease. What should I know about cancer screening? Depending on your health history and family history, you may need to have cancer screening at various ages. This may include screening for: Breast cancer. Cervical cancer. Colorectal cancer. Skin cancer. Lung cancer. What should I know about heart disease, diabetes, and high blood pressure? Blood pressure and heart disease High blood pressure causes heart disease and increases the risk of stroke. This is more likely to develop in people who have high blood pressure readings or are overweight. Have your blood pressure checked: Every 3-5 years if you are 11-36 years of age. Every year if you are 61 years old or older. Diabetes Have regular diabetes screenings. This checks your fasting blood sugar level. Have the screening done: Once every three years after age 49 if you are at a normal weight and have a low risk for diabetes. More often and at a younger age if you are overweight or have a high risk for diabetes. What should I know about preventing infection? Hepatitis B If you have a higher risk for hepatitis B, you  should be screened for this virus. Talk with your health care provider to find out if you are at risk for hepatitis B infection. Hepatitis C Testing is recommended for: Everyone born from [birth year] through 1963. Anyone with known risk factors for hepatitis C. Sexually transmitted infections (STIs) Get screened for STIs, including gonorrhea and chlamydia, if: You are sexually active and are younger than 54 years of age. You are older than 54 years of age and your health care provider tells you that you are at risk for this type of infection. Your sexual activity has changed since you were last screened, and you are at increased risk for chlamydia or gonorrhea. Ask your health care provider if you are at risk. Ask your health care provider about whether you are at high risk for HIV. Your health care provider may recommend a prescription medicine to help prevent HIV infection. If you choose to take medicine to prevent HIV, you should first get tested for HIV. You should then be tested every 3 months for as long as you are taking the medicine. Pregnancy If you are about to stop having your period (premenopausal)  and you may become pregnant, seek counseling before you get pregnant. Take 400 to 800 micrograms (mcg) of folic acid every day if you become pregnant. Ask for birth control (contraception) if you want to prevent pregnancy. Osteoporosis and menopause Osteoporosis is a disease in which the bones lose minerals and strength with aging. This can result in bone fractures. If you are 83 years old or older, or if you are at risk for osteoporosis and fractures, ask your health care provider if you should: Be screened for bone loss. Take a calcium or vitamin D supplement to lower your risk of fractures. Be given hormone replacement therapy (HRT) to treat symptoms of menopause. Follow these instructions at home: Alcohol use Do not drink alcohol if: Your health care provider tells you not to drink. You  are pregnant, may be pregnant, or are planning to become pregnant. If you drink alcohol: Limit how much you have to: 0-1 drink a day. Know how much alcohol is in your drink. In the [place] one drink equals one 12 oz bottle of beer (355 mL), one 5 oz glass of wine (148 mL), or one 1 oz glass of hard liquor (44 mL). Lifestyle Do not use any products that contain nicotine or tobacco. These products include cigarettes, chewing tobacco, and vaping devices, such as e-cigarettes. If you need help quitting, ask your health care provider. Do not use street drugs. Do not share needles. Ask your health care provider for help if you need support or information about quitting drugs. General instructions Schedule regular health, dental, and eye exams. Stay current with your vaccines. Tell your health care provider if: You often feel depressed. You have ever been abused or do not feel safe at home. Summary Adopting a healthy lifestyle and getting preventive care are important in promoting health and wellness. Follow your health care provider's instructions about healthy diet, exercising, and getting tested or screened for diseases. Follow your health care provider's instructions on monitoring your cholesterol and blood pressure. This information is not intended to replace advice given to you by your health care provider. Make sure you discuss any questions you have with your health care provider. Document Revised: 05/11/2020 Document Reviewed: 05/11/2020 Elsevier Patient Education  [place], MD [facility]

## 2021-12-07 NOTE — Telephone Encounter (Signed)
Pharmacy requesting alternative.  What are the choices/options?  They should provide this information to [place].

## 2021-12-07 NOTE — Assessment & Plan Note (Signed)
Diet and nutrition discussed. Hemoglobin A1c done today. Continue metformin 1000 mg twice a day

## 2021-12-07 NOTE — Telephone Encounter (Signed)
PA for [facility] submitted, awaiting response Key: [id number]

## 2021-12-08 NOTE — Telephone Encounter (Signed)
What is the alternative?

## 2021-12-08 NOTE — Telephone Encounter (Signed)
Called patient and left message for patient to call office to verify coverage.

## 2021-12-08 NOTE — Telephone Encounter (Signed)
PA for [facility] denied, appeal started

## 2022-01-04 NOTE — Telephone Encounter (Signed)
Patient called back and said they dont cover wegovy and asked if something else can be sent in

## 2022-01-04 NOTE — Telephone Encounter (Signed)
Called patient and informed her to call her insurance company to see what alternatives are covered. She will contact the office with alternatives

## 2022-01-12 ENCOUNTER — Other Ambulatory Visit: Payer: Self-pay | Admitting: Emergency Medicine

## 2022-01-12 DIAGNOSIS — R7303 Prediabetes: Secondary | ICD-10-CM

## 2022-01-12 DIAGNOSIS — E785 Hyperlipidemia, unspecified: Secondary | ICD-10-CM

## 2022-01-26 NOTE — Telephone Encounter (Signed)
Patient called and said the insurance said if Dr Mitchel Honour wants her to use the [facility] and exemption needs to be sent in, or if Dr Mitchel Honour wants to he can send something else in.

## 2022-01-28 ENCOUNTER — Other Ambulatory Visit (HOSPITAL_COMMUNITY): Payer: Self-pay

## 2022-01-28 NOTE — Telephone Encounter (Signed)
PA was previously denied with the same insurance. Ran test claim, came back product not on formulary, closed formulary. Per previous denial weight loss medication is excluded from pharmacy benefit coverage

## 2022-01-31 ENCOUNTER — Other Ambulatory Visit: Payer: Self-pay | Admitting: Emergency Medicine

## 2022-01-31 MED ORDER — SAXENDA 18 MG/3ML ~~LOC~~ SOPN: 0.6000 mg | PEN_INJECTOR | Freq: Every day | SUBCUTANEOUS | 5 refills | Status: AC

## 2022-01-31 NOTE — Telephone Encounter (Signed)
New prescription for Saxenda sent to pharmacy of record today.

## 2022-01-31 NOTE — Telephone Encounter (Signed)
Duplicate

## 2022-02-01 NOTE — Telephone Encounter (Signed)
Informed patient that a new medication rx was sent to her requested pharmacy

## 2022-03-26 ENCOUNTER — Other Ambulatory Visit: Payer: Self-pay | Admitting: Emergency Medicine

## 2022-03-26 DIAGNOSIS — I890 Lymphedema, not elsewhere classified: Secondary | ICD-10-CM

## 2022-03-31 ENCOUNTER — Ambulatory Visit: Payer: Medicaid Other

## 2022-03-31 ENCOUNTER — Ambulatory Visit: Payer: No Typology Code available for payment source

## 2022-09-30 IMAGING — MG DIGITAL SCREENING BILAT W/ CAD
4 series · 4 of 4 positions shown · non-contrast
Comparison: None.

ACR Breast Density Category a: The breast tissue is almost entirely
fatty.

CLINICAL DATA: Screening.

EXAM:
DIGITAL SCREENING BILATERAL MAMMOGRAM WITH CAD

[L MLO]
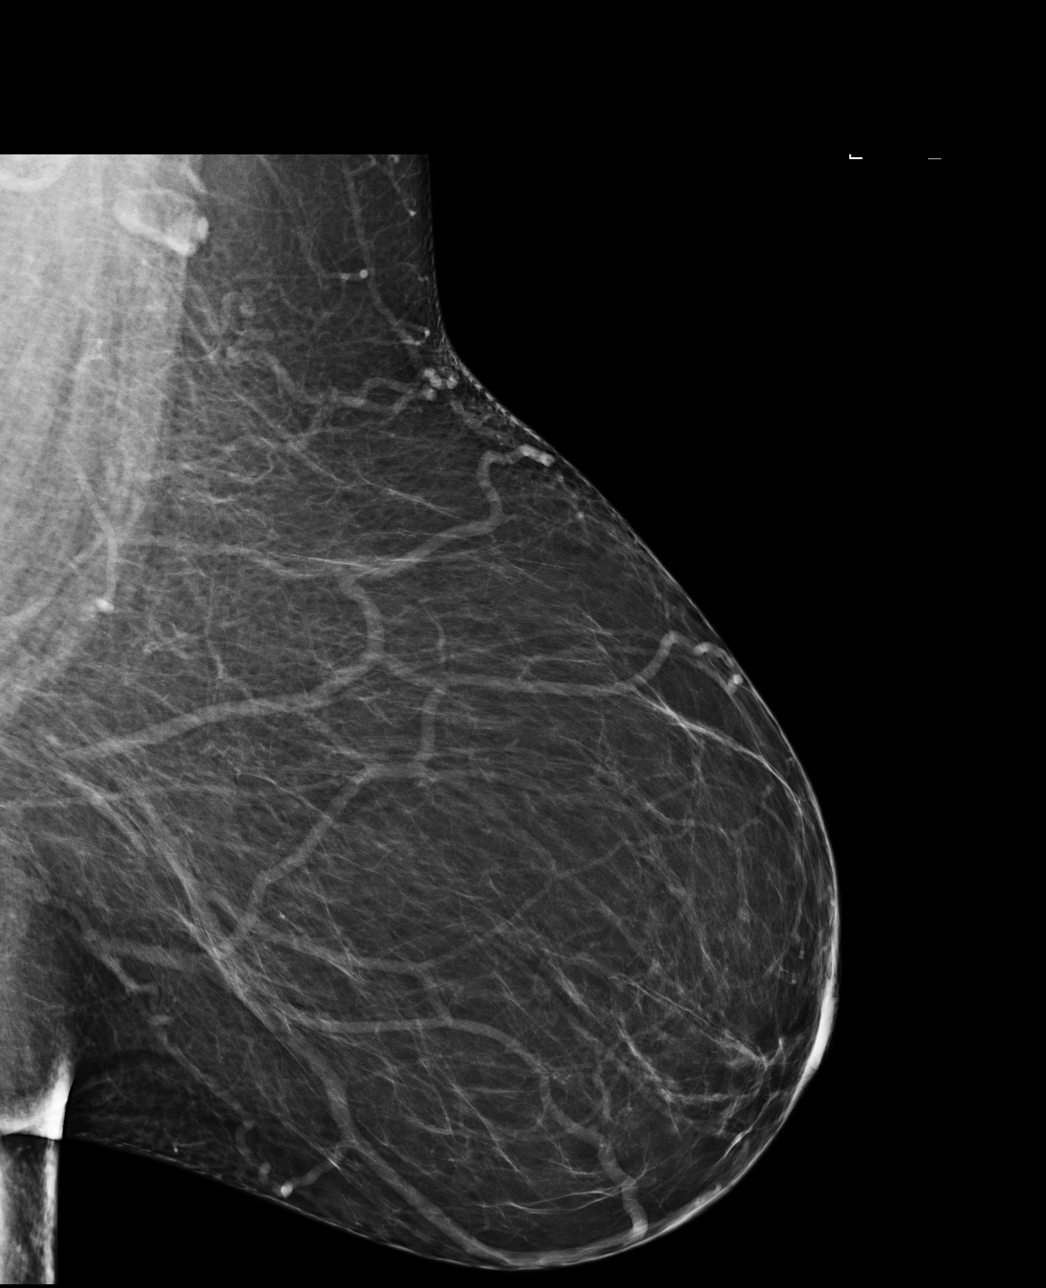

[R CC]
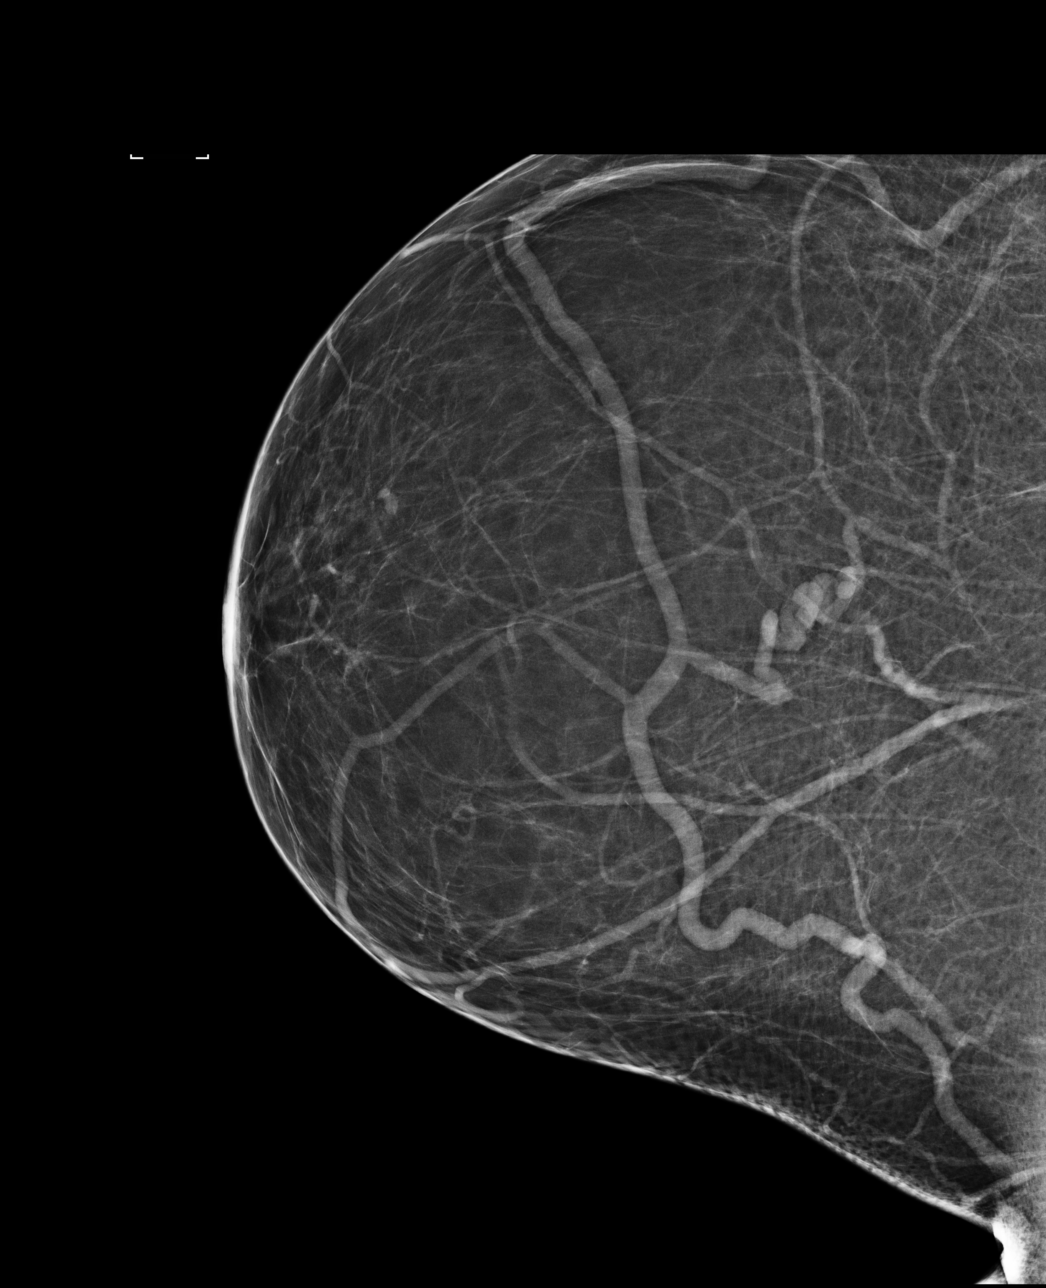

[L CC]
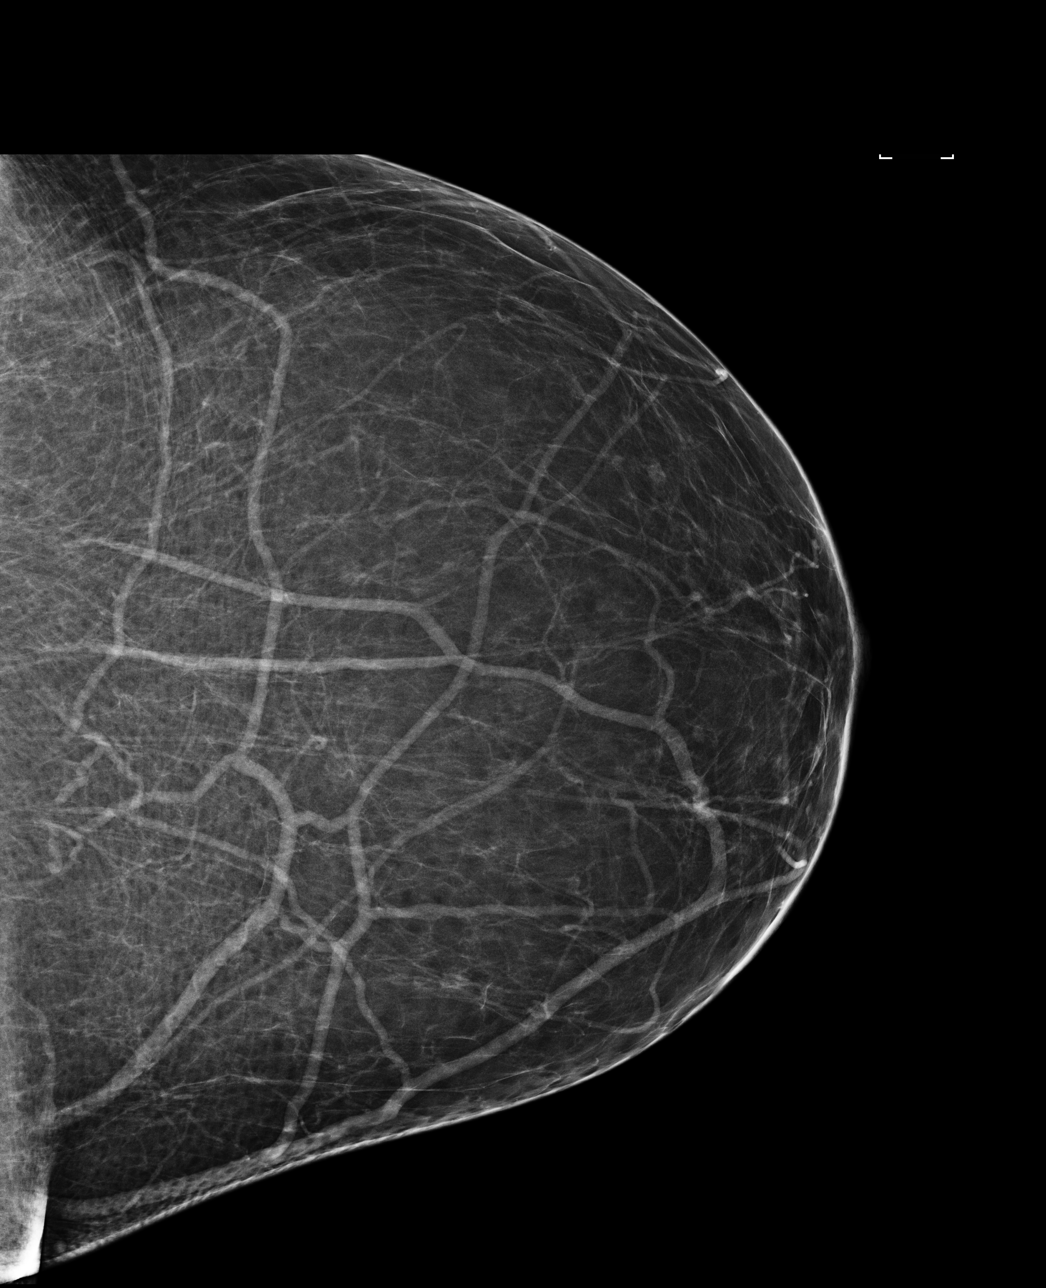

[R MLO]
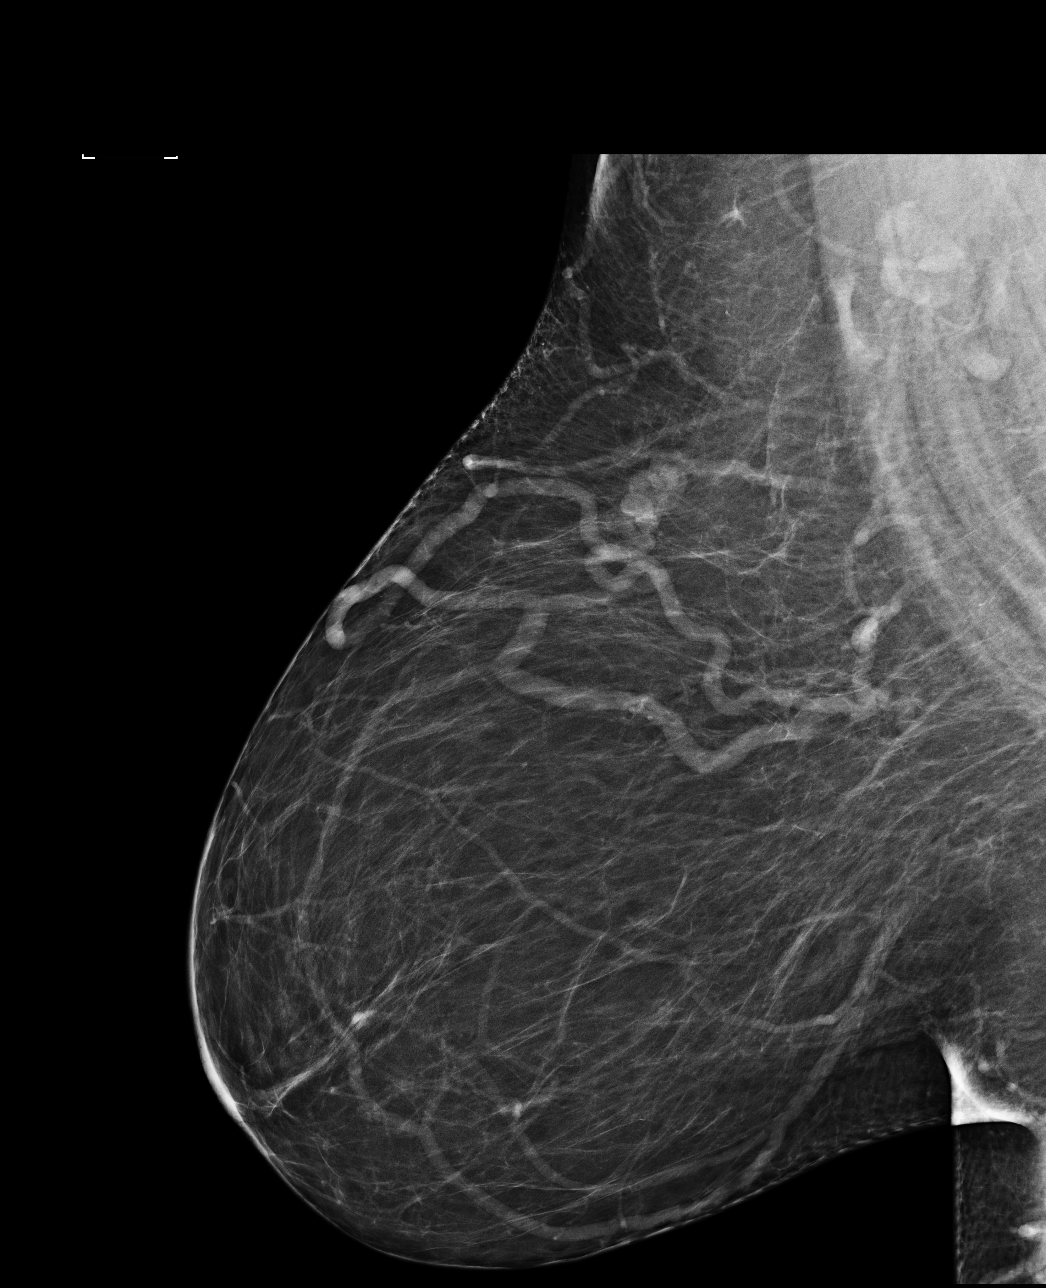

[4 of 4 positions shown; findings below may reference images not displayed]

FINDINGS: There are no findings suspicious for malignancy. Images were
processed with CAD.
IMPRESSION: No mammographic evidence of malignancy. A result letter of this
screening mammogram will be mailed directly to the patient.

RECOMMENDATION:
Screening mammogram in one year. (Code:[id number])

BI-RADS CATEGORY  1: Negative.

## 2022-12-04 ENCOUNTER — Other Ambulatory Visit: Payer: Self-pay | Admitting: Emergency Medicine

## 2022-12-04 DIAGNOSIS — I890 Lymphedema, not elsewhere classified: Secondary | ICD-10-CM

## 2023-01-27 ENCOUNTER — Telehealth: Payer: Self-pay

## 2023-01-27 NOTE — Telephone Encounter (Signed)
Copied from CRM [id number]. Topic: Clinical - Lab/Test Results >> Jan 27, 2023  2:20 PM Michele Ayers wrote: Reason for CRM: Patient called and stated she would like some clarity on her lab results.

## 2023-02-08 LAB — COLOGUARD: COLOGUARD: NEGATIVE

## 2023-03-14 ENCOUNTER — Other Ambulatory Visit: Payer: Self-pay | Admitting: Emergency Medicine

## 2023-10-27 IMAGING — DX DG CHEST 2V
2 series · 2 of 2 positions shown · non-contrast
Comparison: 03/23/2015

CLINICAL DATA: 55-year-old female with positive TB screening test

EXAM:
CHEST - 2 VIEW

[dg chest 2 view (1 of 2)]
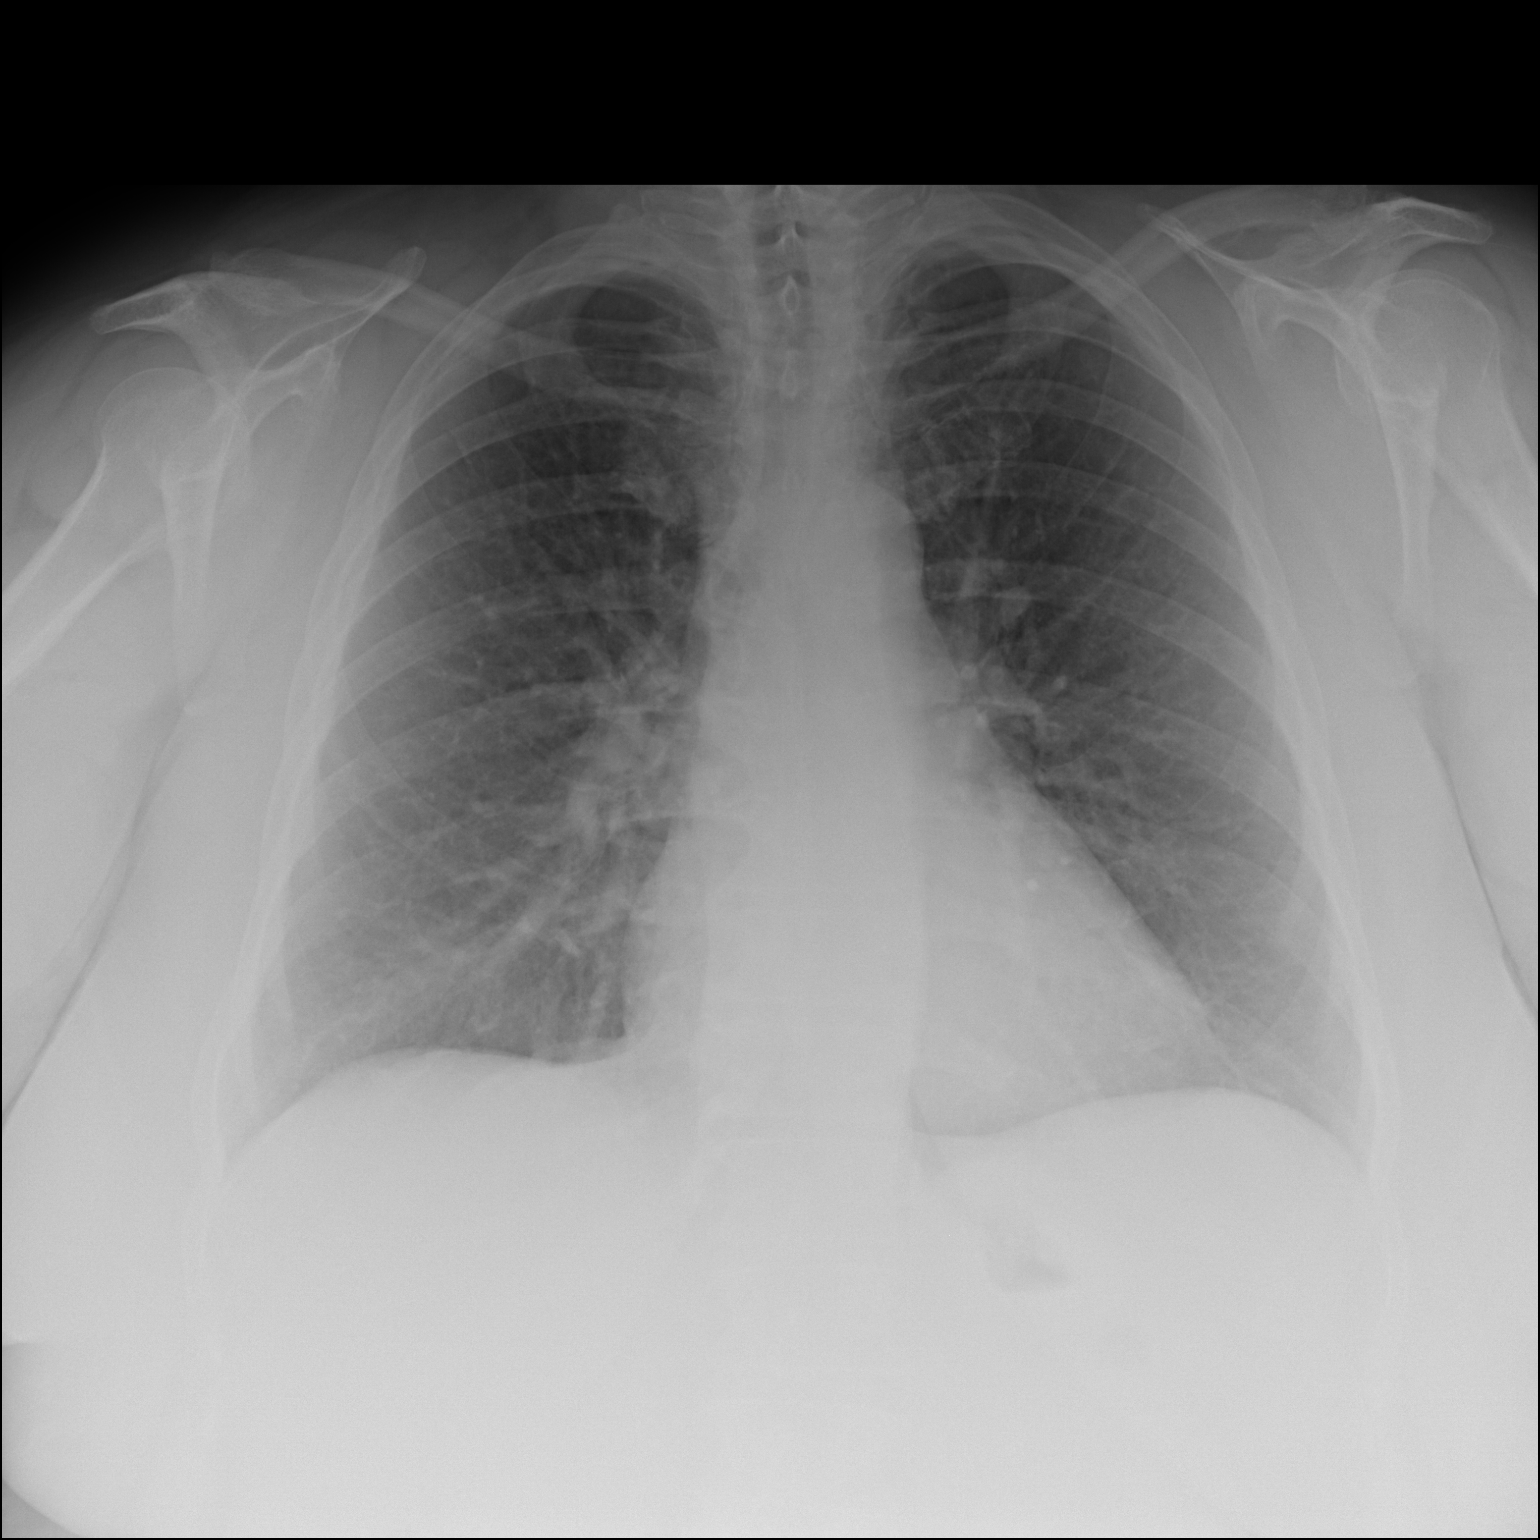

[dg chest 2 view (2 of 2)]
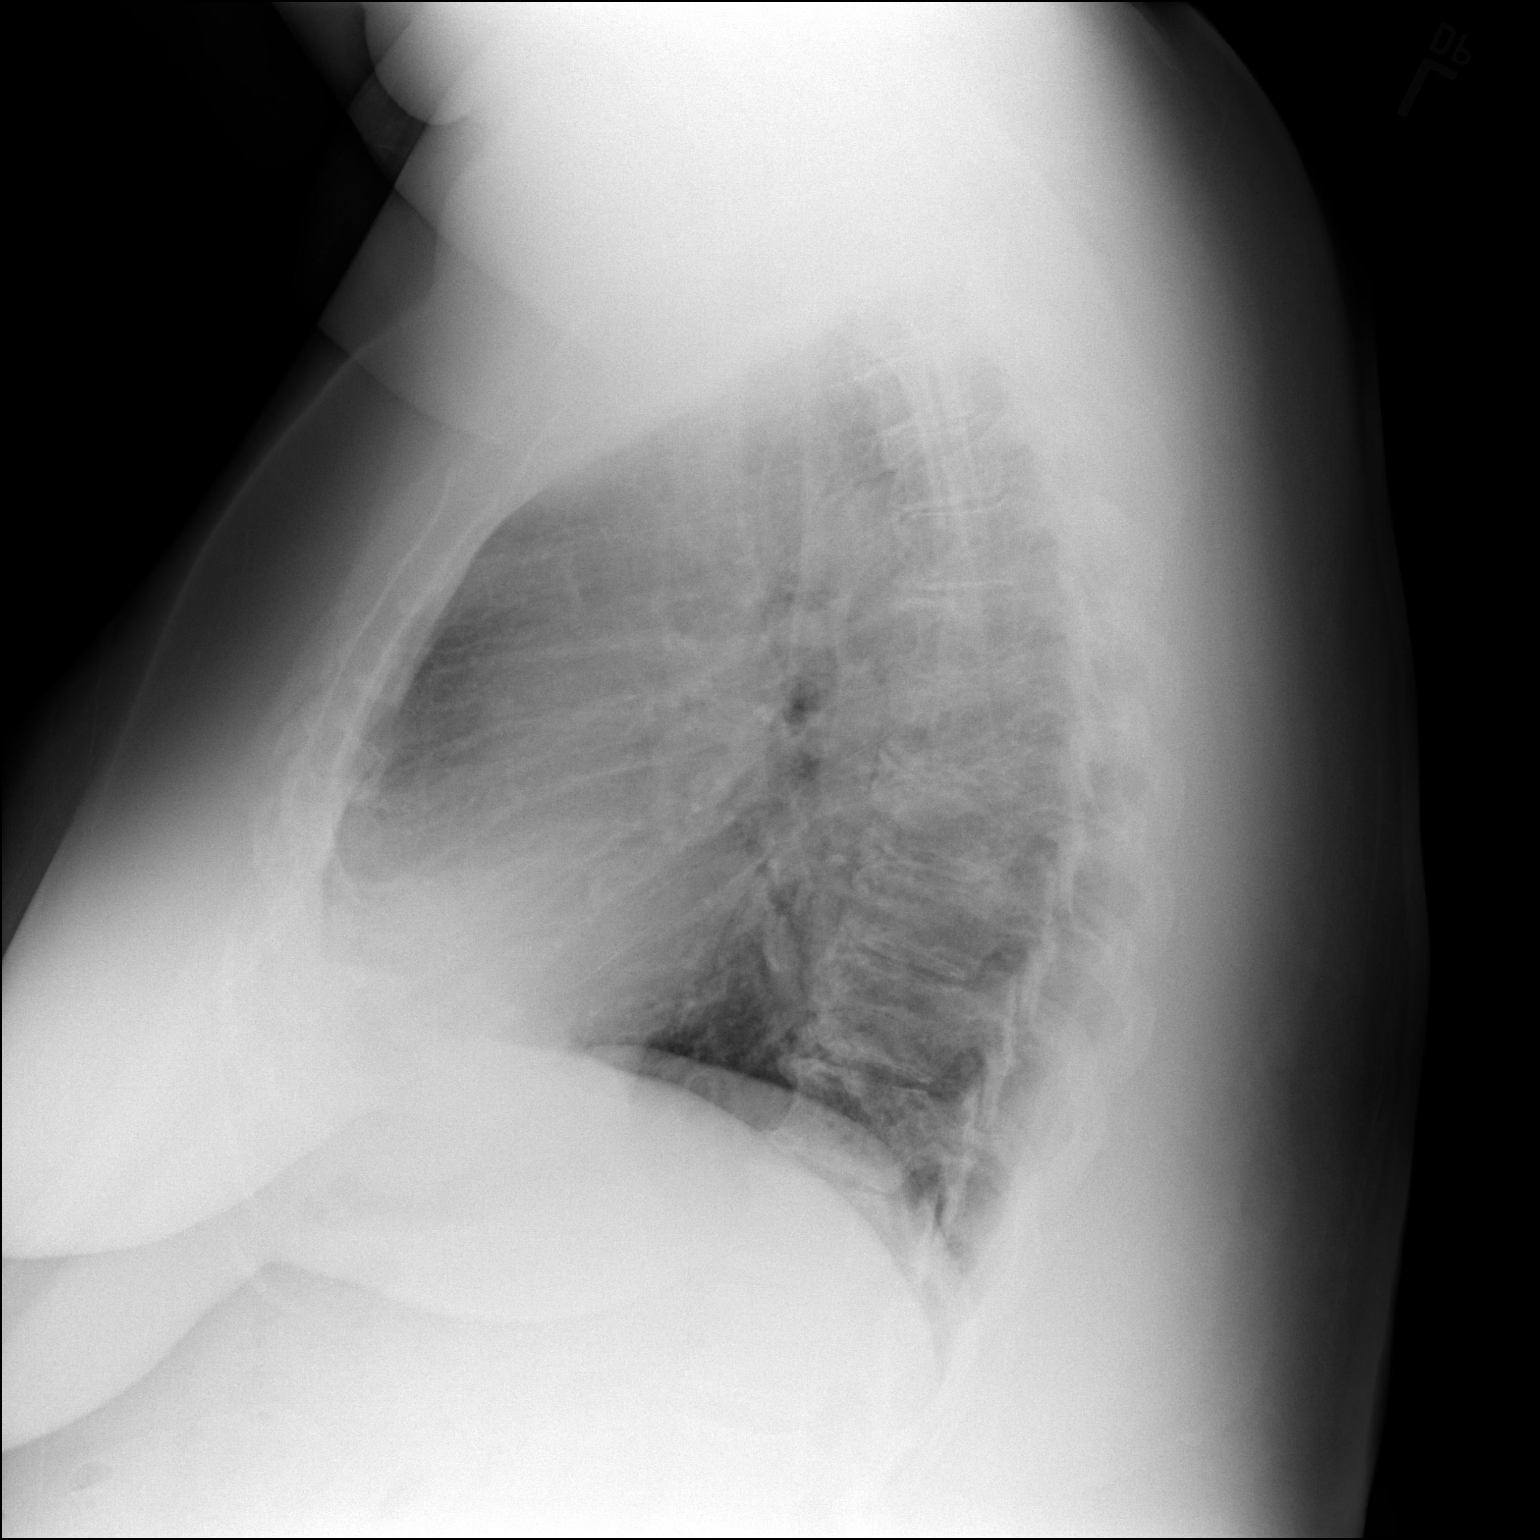

[2 of 2 positions shown; findings below may reference images not displayed]

FINDINGS: Cardiomediastinal silhouette unchanged in size and contour. No
evidence of central vascular congestion. No interlobular septal
thickening.

No pneumothorax or pleural effusion. Coarsened interstitial
markings, with no confluent airspace disease.

No acute displaced fracture. Degenerative changes of the spine.
IMPRESSION: No active cardiopulmonary disease.
